# Patient Record
Sex: Female | Born: 1990 | Race: Black or African American | Hispanic: No | Marital: Single | Smoking: Never smoker
Health system: Southern US, Community
[De-identification: ages and names within clinical notes are randomized; demographics above are authoritative.]

## PROBLEM LIST (undated history)

## (undated) ENCOUNTER — Inpatient Hospital Stay (HOSPITAL_COMMUNITY): Payer: Self-pay

## (undated) DIAGNOSIS — Z789 Other specified health status: Secondary | ICD-10-CM

## (undated) DIAGNOSIS — R519 Headache, unspecified: Secondary | ICD-10-CM

## (undated) DIAGNOSIS — R51 Headache: Secondary | ICD-10-CM

## (undated) DIAGNOSIS — E669 Obesity, unspecified: Secondary | ICD-10-CM

## (undated) DIAGNOSIS — G932 Benign intracranial hypertension: Secondary | ICD-10-CM

## (undated) HISTORY — DX: Headache: R51

## (undated) HISTORY — PX: NO PAST SURGERIES: SHX2092

## (undated) HISTORY — DX: Obesity, unspecified: E66.9

## (undated) HISTORY — DX: Headache, unspecified: R51.9

---

## 2009-12-05 ENCOUNTER — Emergency Department (HOSPITAL_COMMUNITY): Admission: EM | Admit: 2009-12-05 | Discharge: 2009-12-05 | Payer: Self-pay | Admitting: Family Medicine

## 2009-12-05 ENCOUNTER — Emergency Department (HOSPITAL_COMMUNITY): Admission: EM | Admit: 2009-12-05 | Discharge: 2009-12-05 | Payer: Self-pay | Admitting: Emergency Medicine

## 2009-12-31 ENCOUNTER — Emergency Department (HOSPITAL_COMMUNITY): Admission: EM | Admit: 2009-12-31 | Discharge: 2009-12-31 | Payer: Self-pay | Admitting: Emergency Medicine

## 2010-04-27 ENCOUNTER — Emergency Department (HOSPITAL_COMMUNITY): Admission: EM | Admit: 2010-04-27 | Discharge: 2010-04-27 | Payer: Self-pay | Admitting: Emergency Medicine

## 2010-08-26 LAB — URINALYSIS, ROUTINE W REFLEX MICROSCOPIC
Ketones, ur: NEGATIVE mg/dL
Nitrite: NEGATIVE
pH: 6 (ref 5.0–8.0)

## 2010-08-26 LAB — PREGNANCY, URINE: Preg Test, Ur: NEGATIVE

## 2010-08-26 LAB — URINE MICROSCOPIC-ADD ON

## 2010-08-30 LAB — POCT PREGNANCY, URINE: Preg Test, Ur: NEGATIVE

## 2010-08-30 LAB — POCT I-STAT, CHEM 8
BUN: 9 mg/dL (ref 6–23)
Hemoglobin: 14.3 g/dL (ref 12.0–15.0)
Sodium: 141 mEq/L (ref 135–145)

## 2010-08-31 LAB — COMPREHENSIVE METABOLIC PANEL
ALT: 15 U/L (ref 0–35)
AST: 16 U/L (ref 0–37)
Albumin: 3.9 g/dL (ref 3.5–5.2)
Alkaline Phosphatase: 51 U/L (ref 39–117)
BUN: 7 mg/dL (ref 6–23)
CO2: 25 mEq/L (ref 19–32)
Calcium: 8.7 mg/dL (ref 8.4–10.5)
Chloride: 108 mEq/L (ref 96–112)
Creatinine, Ser: 0.8 mg/dL (ref 0.4–1.2)
GFR calc Af Amer: >60 mL/min (ref >60)
GFR calc non Af Amer: >60 mL/min (ref >60)
Glucose, Bld: 85 mg/dL (ref 70–99)
Potassium: 3.9 mEq/L (ref 3.5–5.1)
Sodium: 140 mEq/L (ref 135–145)
Total Bilirubin: 0.5 mg/dL (ref 0.3–1.2)
Total Protein: 7.3 g/dL (ref 6.0–8.3)

## 2010-08-31 LAB — DIFFERENTIAL
Basophils Absolute: 0.1 10*3/uL (ref 0.0–0.1)
Basophils Relative: 1 % (ref 0–1)
Eosinophils Absolute: 0 10*3/uL (ref 0.0–0.7)
Eosinophils Relative: 0 % (ref 0–5)
Lymphocytes Relative: 26 % (ref 12–46)
Lymphs Abs: 1.8 10*3/uL (ref 0.7–4.0)
Monocytes Absolute: 0.3 10*3/uL (ref 0.1–1.0)
Monocytes Relative: 4 % (ref 3–12)
Neutro Abs: 4.7 10*3/uL (ref 1.7–7.7)
Neutrophils Relative %: 69 % (ref 43–77)

## 2010-08-31 LAB — URINALYSIS, ROUTINE W REFLEX MICROSCOPIC
Bilirubin Urine: NEGATIVE
Glucose, UA: NEGATIVE mg/dL
Hgb urine dipstick: NEGATIVE
Protein, ur: NEGATIVE mg/dL
Specific Gravity, Urine: 1.028 (ref 1.005–1.030)

## 2010-08-31 LAB — CBC
HCT: 38.1 % (ref 36.0–46.0)
Hemoglobin: 12.5 g/dL (ref 12.0–15.0)
MCH: 29.4 pg (ref 26.0–34.0)
MCHC: 32.9 g/dL (ref 30.0–36.0)
MCV: 89.6 fL (ref 78.0–100.0)
Platelets: 251 10*3/uL (ref 150–400)
RBC: 4.25 MIL/uL (ref 3.87–5.11)
RDW: 15.2 % (ref 11.5–15.5)
WBC: 6.9 10*3/uL (ref 4.0–10.5)

## 2010-08-31 LAB — PREGNANCY, URINE: Preg Test, Ur: NEGATIVE

## 2010-09-04 ENCOUNTER — Emergency Department (HOSPITAL_COMMUNITY)
Admission: EM | Admit: 2010-09-04 | Discharge: 2010-09-04 | Disposition: A | Discharge status: Home / Self Care | Payer: Self-pay | Attending: Emergency Medicine | Admitting: Emergency Medicine

## 2011-01-28 ENCOUNTER — Other Ambulatory Visit: Payer: Self-pay | Admitting: Family Medicine

## 2011-01-28 DIAGNOSIS — Z3682 Encounter for antenatal screening for nuchal translucency: Secondary | ICD-10-CM

## 2011-01-28 LAB — RUBELLA ANTIBODY, IGM: Rubella: NON-IMMUNE/NOT IMMUNE

## 2011-01-28 LAB — HEPATITIS B SURFACE ANTIGEN: Hepatitis B Surface Ag: NEGATIVE

## 2011-01-28 LAB — RPR: RPR: NONREACTIVE

## 2011-02-04 ENCOUNTER — Ambulatory Visit (HOSPITAL_COMMUNITY)
Admission: RE | Admit: 2011-02-04 | Discharge: 2011-02-04 | Disposition: A | Discharge status: Home / Self Care | Payer: Medicaid Other | Source: Ambulatory Visit | Attending: Family Medicine | Admitting: Family Medicine

## 2011-02-04 ENCOUNTER — Encounter (HOSPITAL_COMMUNITY): Payer: Self-pay

## 2011-02-04 DIAGNOSIS — O3510X0 Maternal care for (suspected) chromosomal abnormality in fetus, unspecified, not applicable or unspecified: Secondary | ICD-10-CM | POA: Insufficient documentation

## 2011-02-04 DIAGNOSIS — Z3480 Encounter for supervision of other normal pregnancy, unspecified trimester: Secondary | ICD-10-CM

## 2011-02-04 DIAGNOSIS — Z3689 Encounter for other specified antenatal screening: Secondary | ICD-10-CM | POA: Insufficient documentation

## 2011-02-04 DIAGNOSIS — O351XX0 Maternal care for (suspected) chromosomal abnormality in fetus, not applicable or unspecified: Secondary | ICD-10-CM | POA: Insufficient documentation

## 2011-02-04 DIAGNOSIS — Z3682 Encounter for antenatal screening for nuchal translucency: Secondary | ICD-10-CM

## 2011-02-04 NOTE — Progress Notes (Signed)
Patient seen for ultrasound only appointment today.  Please see [facility] report for details.  

## 2011-03-04 ENCOUNTER — Other Ambulatory Visit: Payer: Self-pay | Admitting: Maternal and Fetal Medicine

## 2011-03-04 ENCOUNTER — Ambulatory Visit (HOSPITAL_COMMUNITY)
Admission: RE | Admit: 2011-03-04 | Discharge: 2011-03-04 | Disposition: A | Discharge status: Home / Self Care | Payer: Medicaid Other | Source: Ambulatory Visit | Attending: Family Medicine | Admitting: Family Medicine

## 2011-03-04 DIAGNOSIS — O3510X0 Maternal care for (suspected) chromosomal abnormality in fetus, unspecified, not applicable or unspecified: Secondary | ICD-10-CM | POA: Insufficient documentation

## 2011-03-04 DIAGNOSIS — O351XX0 Maternal care for (suspected) chromosomal abnormality in fetus, not applicable or unspecified: Secondary | ICD-10-CM | POA: Insufficient documentation

## 2011-03-25 ENCOUNTER — Other Ambulatory Visit: Payer: Self-pay | Admitting: Family Medicine

## 2011-03-25 DIAGNOSIS — Z3689 Encounter for other specified antenatal screening: Secondary | ICD-10-CM

## 2011-03-26 ENCOUNTER — Ambulatory Visit (HOSPITAL_COMMUNITY)
Admission: RE | Admit: 2011-03-26 | Discharge: 2011-03-26 | Disposition: A | Discharge status: Home / Self Care | Payer: Medicaid Other | Source: Ambulatory Visit | Attending: Family Medicine | Admitting: Family Medicine

## 2011-03-26 DIAGNOSIS — Z3689 Encounter for other specified antenatal screening: Secondary | ICD-10-CM

## 2011-03-26 DIAGNOSIS — O358XX0 Maternal care for other (suspected) fetal abnormality and damage, not applicable or unspecified: Secondary | ICD-10-CM | POA: Insufficient documentation

## 2011-03-26 DIAGNOSIS — Z363 Encounter for antenatal screening for malformations: Secondary | ICD-10-CM | POA: Insufficient documentation

## 2011-03-26 DIAGNOSIS — Z1389 Encounter for screening for other disorder: Secondary | ICD-10-CM | POA: Insufficient documentation

## 2011-04-23 ENCOUNTER — Inpatient Hospital Stay (HOSPITAL_COMMUNITY)
Admission: AD | Admit: 2011-04-23 | Discharge: 2011-04-23 | Disposition: A | Discharge status: Home / Self Care | Payer: Medicaid Other | Source: Ambulatory Visit | Attending: Obstetrics & Gynecology | Admitting: Obstetrics & Gynecology

## 2011-04-23 ENCOUNTER — Encounter (HOSPITAL_COMMUNITY): Payer: Self-pay | Admitting: *Deleted

## 2011-04-23 DIAGNOSIS — Z3490 Encounter for supervision of normal pregnancy, unspecified, unspecified trimester: Secondary | ICD-10-CM

## 2011-04-23 DIAGNOSIS — O4700 False labor before 37 completed weeks of gestation, unspecified trimester: Secondary | ICD-10-CM | POA: Insufficient documentation

## 2011-04-23 HISTORY — DX: Other specified health status: Z78.9

## 2011-04-23 LAB — URINALYSIS, ROUTINE W REFLEX MICROSCOPIC
Hgb urine dipstick: NEGATIVE
Leukocytes, UA: NEGATIVE
Nitrite: NEGATIVE
Protein, ur: NEGATIVE mg/dL
Urobilinogen, UA: 0.2 mg/dL (ref 0.0–1.0)

## 2011-04-23 LAB — WET PREP, GENITAL: Trich, Wet Prep: NONE SEEN

## 2011-04-23 LAB — AMNISURE RUPTURE OF MEMBRANE (ROM) NOT AT ARMC: Amnisure ROM: NEGATIVE

## 2011-04-23 NOTE — Progress Notes (Signed)
Pt in left sidelying position

## 2011-04-23 NOTE — Progress Notes (Signed)
Ultrasound called and reminded that pt is waiting for ultrasound. Given ginerale and informed that it would be a little longer before she went to ultrasound

## 2011-04-23 NOTE — Progress Notes (Signed)
Pt states she started trickle of blood about 1615. Pt states she then noticed some cramping and then pt states she noticed clear liquid

## 2011-04-23 NOTE — ED Provider Notes (Signed)
History     Chief Complaint  Patient presents with  . Contractions   HPI 20 y.o. at [redacted]w[redacted]d presents with c/o leaking of fluid today. Had wetness on clothing and a trickle of blood.  No contractions. Good fetal movement.  Worried she will leak fluid and not know it. OB History    Grav Para Term Preterm Abortions TAB SAB Ect Mult Living   2 1 1  0 0 0 0 0 0 1      Past Medical History  Diagnosis Date  . No pertinent past medical history     Past Surgical History  Procedure Date  . No past surgeries     No family history on file.  History  Substance Use Topics  . Smoking status: Never Smoker   . Smokeless tobacco: Not on file  . Alcohol Use: No    Allergies: No Known Allergies  Prescriptions prior to admission  Medication Sig Dispense Refill  . metoCLOPramide (REGLAN) 10 MG tablet Take 10 mg by mouth 4 (four) times daily. nausea       . prenatal vitamin w/FE, FA (PRENATAL 1 + 1) 27-1 MG TABS Take 1 tablet by mouth daily.          ROS As above  Physical Exam   Blood pressure 110/70, pulse 96, temperature 99.3 F (37.4 C), temperature source Oral, resp. rate 20, height 5\' 2"  (1.575 m), weight 100.517 kg (221 lb 9.6 oz), last menstrual period 11/12/2010, SpO2 99.00%.  Physical Exam  Constitutional: She is oriented to person, place, and time. She appears well-developed and well-nourished. No distress.  HENT:  Head: Normocephalic.  Cardiovascular: Normal rate.   Respiratory: Effort normal.  GI: Soft. There is no tenderness.  Genitourinary: Uterus normal. Vaginal discharge found.       No pooling, no ferning, no blood.   Musculoskeletal: Normal range of motion.  Neurological: She is alert and oriented to person, place, and time.  Skin: Skin is warm and dry.  Psychiatric: She has a normal mood and affect.    MAU Course  Procedures   Assessment and Plan  A:  Reports leaking and bleeding. No evidence of current leaking or bleeding. P:  WIll check [place]           [facility] 04/23/2011, 7:05 PM

## 2011-04-23 NOTE — Progress Notes (Signed)
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

## 2011-04-23 NOTE — Progress Notes (Signed)
Patient states she has been having irregular contractions. Had some spotting earlier then started to leak clear fluid. Not wearing a pad at this time, but states she had to change her panties. Reports good fetal movement.

## 2011-05-28 ENCOUNTER — Inpatient Hospital Stay (HOSPITAL_COMMUNITY)
Admission: AD | Admit: 2011-05-28 | Discharge: 2011-05-28 | Disposition: A | Discharge status: Home / Self Care | Payer: Medicaid Other | Source: Ambulatory Visit | Attending: Obstetrics & Gynecology | Admitting: Obstetrics & Gynecology

## 2011-05-28 ENCOUNTER — Encounter (HOSPITAL_COMMUNITY): Payer: Self-pay | Admitting: *Deleted

## 2011-05-28 DIAGNOSIS — R05 Cough: Secondary | ICD-10-CM | POA: Insufficient documentation

## 2011-05-28 DIAGNOSIS — J029 Acute pharyngitis, unspecified: Secondary | ICD-10-CM | POA: Insufficient documentation

## 2011-05-28 DIAGNOSIS — R059 Cough, unspecified: Secondary | ICD-10-CM | POA: Insufficient documentation

## 2011-05-28 DIAGNOSIS — R51 Headache: Secondary | ICD-10-CM | POA: Insufficient documentation

## 2011-05-28 DIAGNOSIS — R6889 Other general symptoms and signs: Secondary | ICD-10-CM

## 2011-05-28 DIAGNOSIS — O99891 Other specified diseases and conditions complicating pregnancy: Secondary | ICD-10-CM | POA: Insufficient documentation

## 2011-05-28 MED ORDER — OSELTAMIVIR PHOSPHATE 75 MG PO CAPS: 75.0000 mg | ORAL_CAPSULE | Freq: Two times a day (BID) | ORAL | Status: AC

## 2011-05-28 MED ORDER — ACETAMINOPHEN 500 MG PO CHEW: 500.0000 mg | CHEWABLE_TABLET | Freq: Four times a day (QID) | ORAL | Status: AC | PRN

## 2011-05-28 MED ORDER — ACETAMINOPHEN 325 MG PO TABS
650.0000 mg | ORAL_TABLET | Freq: Once | ORAL | Status: AC
  Administered 2011-05-28: 650 mg via ORAL
  Filled 2011-05-28: qty 2

## 2011-05-28 MED ORDER — ONDANSETRON 4 MG PO TBDP
4.0000 mg | ORAL_TABLET | Freq: Once | ORAL | Status: AC
  Administered 2011-05-28: 4 mg via ORAL
  Filled 2011-05-28: qty 1

## 2011-05-28 NOTE — ED Provider Notes (Signed)
History    CC: flu-like symptoms  HPI  Patient presents to MAU complaining of fever, chills, cough, sore throat, headache, and myalgias.  Symptoms started 2 days ago.  Patient has had a flu shot this year.  This morning, patient had one episode of emesis but has felt nauseated all day.  She has been able to keep down orange juice and fluids in the MAU.  Patient denies any diarrhea or abdominal pain.  Baby is moving well.  Denies any contractions, pelvic pain, vaginal bleeding, or discharge or burning with urination.  OB History    Grav Para Term Preterm Abortions TAB SAB Ect Mult Living   2 1 1  0 0 0 0 0 0 1      Past Medical History  Diagnosis Date  . No pertinent past medical history     Past Surgical History  Procedure Date  . No past surgeries     No family history on file.  History  Substance Use Topics  . Smoking status: Never Smoker   . Smokeless tobacco: Not on file  . Alcohol Use: No    Allergies: No Known Allergies  Prescriptions prior to admission  Medication Sig Dispense Refill  . prenatal vitamin w/FE, FA (PRENATAL 1 + 1) 27-1 MG TABS Take 1 tablet by mouth daily.        . metoCLOPramide (REGLAN) 10 MG tablet Take 10 mg by mouth 4 (four) times daily. nausea         ROS  Per HPI  Physical Exam   Blood pressure 130/69, pulse 115, temperature 100.7 F (38.2 C), temperature source Oral, resp. rate 16, height 5\' 3"  (1.6 m), weight 100.245 kg (221 lb), last menstrual period 11/12/2010, SpO2 98.00%.  Physical Exam  Constitutional: No distress.  HENT:  Head: Normocephalic and atraumatic.       MM dry  Eyes: Conjunctivae are normal. Pupils are equal, round, and reactive to light. Right eye exhibits no discharge. Left eye exhibits no discharge. No scleral icterus.  Neck: Normal range of motion. Neck supple.  Cardiovascular: Normal rate, regular rhythm, normal heart sounds and intact distal pulses.  Exam reveals no gallop and no friction rub.   No murmur  heard. Respiratory: Effort normal and breath sounds normal. She has no wheezes. She has no rales.  GI: Soft. Bowel sounds are normal. She exhibits no distension. There is no tenderness. There is no rebound and no guarding.  Musculoskeletal: Normal range of motion. She exhibits no edema and no tenderness.  Lymphadenopathy:    She has no cervical adenopathy.  Skin: No rash noted. No erythema.    MAU Course  Procedures Rapid strep, Flu PCR, NST  Assessment and Plan  1) Flu-like symptoms: will order flu PCR and rapid strep.  Will give Rx for Tamiflu and Tylenol.  Encourage pushing PO fluids and plenty of rest.  Patient able to tolerate PO fluids and crackers in MAU.  Red flags reviewed.  Follow up @ HD if symptoms worsen. 2) Reassuring FHR 3) Disposition: Discharge home  Discussed case with Caren Griffins who agreed with my assessment and plan.  DE LA Liu,Candace 05/28/2011, 8:34 PM    Agree with above Candace Liu 05/28/2011 11:11 PM

## 2011-05-28 NOTE — Progress Notes (Addendum)
Can't keep anything down. Feel awful, productive cough. Denies fever, chills.  Has had flu shot. (sister in law has the flu). Started on tues.

## 2011-05-28 NOTE — Progress Notes (Signed)
Pt. Hasn't been able to keep anything down for the last 2 days.  She has body aches and a cough.  She is also congested and just found out today that she has a fever.

## 2011-05-29 LAB — INFLUENZA PANEL BY PCR (TYPE A & B)
Influenza A By PCR: POSITIVE — AB
Influenza B By PCR: NEGATIVE

## 2011-05-29 LAB — RAPID STREP SCREEN (MED CTR MEBANE ONLY): Streptococcus, Group A Screen (Direct): NEGATIVE

## 2011-06-16 NOTE — L&D Delivery Note (Signed)
Delivery Note At 7:18 PM a viable and healthy female was delivered via Vaginal, Spontaneous Delivery (Presentation:OA).  APGAR: 8, 9; weight .   Placenta status: Intact, Spontaneous.  Cord: 3 vessels with the following complications: .None Head delivered in one push, shoulders delivered in the next.   No difficulty with shoulders Anesthesia: Epidural  Episiotomy: None Lacerations: 1 degree midline, not bleeding, not repaired Suture Repair:  Est. Blood Loss (mL): 150  Mom to postpartum.  Baby to nursery-stable.  [facility] 08/21/2011, 7:31 PM

## 2011-07-20 DIAGNOSIS — Z3490 Encounter for supervision of normal pregnancy, unspecified, unspecified trimester: Secondary | ICD-10-CM

## 2011-07-31 LAB — GC/CHLAMYDIA PROBE AMP, GENITAL: Chlamydia: NEGATIVE

## 2011-08-02 LAB — STREP B DNA PROBE: GBS: NEGATIVE

## 2011-08-13 ENCOUNTER — Encounter (HOSPITAL_COMMUNITY): Payer: Self-pay | Admitting: *Deleted

## 2011-08-13 ENCOUNTER — Inpatient Hospital Stay (HOSPITAL_COMMUNITY)
Admission: AD | Admit: 2011-08-13 | Discharge: 2011-08-13 | Disposition: A | Discharge status: Home / Self Care | Payer: Medicaid Other | Source: Ambulatory Visit | Attending: Obstetrics & Gynecology | Admitting: Obstetrics & Gynecology

## 2011-08-13 DIAGNOSIS — O479 False labor, unspecified: Secondary | ICD-10-CM

## 2011-08-13 MED ORDER — OXYCODONE-ACETAMINOPHEN 5-325 MG PO TABS: 2.0000 | ORAL_TABLET | Freq: Once | ORAL | Status: DC

## 2011-08-13 NOTE — Progress Notes (Signed)
Chief Complaint:  Contractions   Candace Liu is  21 y.o. G2P1001 at [redacted]w[redacted]d presents complaining of Contractions .  She states irregular, every 5-10 minutes contractions are associated with none vaginal bleeding, intact membranes, along with active fetal movement.   Obstetrical/Gynecological History: Menstrual History: OB History    Grav Para Term Preterm Abortions TAB SAB Ect Mult Living   2 1 1  0 0 0 0 0 0 1       Patient's last menstrual period was 11/12/2010.     Past Medical History: Past Medical History  Diagnosis Date  . No pertinent past medical history     Past Surgical History: Past Surgical History  Procedure Date  . No past surgeries     Family History: History reviewed. No pertinent family history.  Social History: History  Substance Use Topics  . Smoking status: Never Smoker   . Smokeless tobacco: Not on file  . Alcohol Use: No    Allergies: No Known Allergies  Meds:  Prescriptions prior to admission  Medication Sig Dispense Refill  . metoCLOPramide (REGLAN) 10 MG tablet Take 10 mg by mouth 4 (four) times daily. nausea       . Prenatal Vit-Fe Fumarate-FA (PRENATAL MULTIVITAMIN) TABS Take 1 tablet by mouth daily.      Marland Kitchen DISCONTD: prenatal vitamin w/FE, FA (PRENATAL 1 + 1) 27-1 MG TABS Take 1 tablet by mouth daily.          Review of Systems - Please refer to the aforementioned patients' reports.     Physical Exam  Blood pressure 134/81, pulse 97, temperature 98.1 F (36.7 C), temperature source Oral, resp. rate 20, height 5\' 5"  (1.651 m), weight 236 lb (107.049 kg), last menstrual period 11/12/2010, SpO2 98.00%. GENERAL: Well-developed, well-nourished female in no acute distress.  LUNGS: Clear to auscultation bilaterally.  HEART: Regular rate and rhythm. ABDOMEN: Soft, nontender, nondistended, gravid.  EXTREMITIES: Nontender, no edema, 2+ distal pulses. CERVICAL EXAM: Dilatation 4/cm   Effacement 100%   Station -2   Presentation:  cephalic FHT:  Baseline rate 140 bpm   Variability moderate  Accelerations present   Decelerations none Contractions: Every 3-8 mins  Pt has been observed for 3 hours without cervical change Labs: No results found for this or any previous visit (from the past 24 hour(s)). Imaging Studies:  No results found.  Assessment: [name] is  21 y.o. G2P1001 at [redacted]w[redacted]d presents with early/false labor.  Plan: D/C home.  Return when contractions increase in frequency/intensity, or SROM  CRESENZO-DISHMAN,Babe Clenney 2/28/201311:04 PM

## 2011-08-13 NOTE — Discharge Instructions (Signed)

## 2011-08-21 ENCOUNTER — Other Ambulatory Visit: Payer: Self-pay | Admitting: Obstetrics & Gynecology

## 2011-08-21 ENCOUNTER — Inpatient Hospital Stay (HOSPITAL_COMMUNITY): Payer: Medicaid Other | Admitting: Anesthesiology

## 2011-08-21 ENCOUNTER — Encounter (HOSPITAL_COMMUNITY): Payer: Self-pay | Admitting: Anesthesiology

## 2011-08-21 ENCOUNTER — Encounter (HOSPITAL_COMMUNITY): Payer: Self-pay | Admitting: *Deleted

## 2011-08-21 ENCOUNTER — Inpatient Hospital Stay (HOSPITAL_COMMUNITY)
Admission: AD | Admit: 2011-08-21 | Discharge: 2011-08-23 | DRG: 775 | Disposition: A | Discharge status: Home / Self Care | Payer: Medicaid Other | Source: Ambulatory Visit | Attending: Obstetrics and Gynecology | Admitting: Obstetrics and Gynecology

## 2011-08-21 LAB — CBC
HCT: 35.4 % — ABNORMAL LOW (ref 36.0–46.0)
MCH: 29 pg (ref 26.0–34.0)
MCHC: 33.1 g/dL (ref 30.0–36.0)
MCV: 88.3 fL (ref 78.0–100.0)
MCV: 88.6 fL (ref 78.0–100.0)
Platelets: 238 10*3/uL (ref 150–400)
RDW: 14.6 % (ref 11.5–15.5)
RDW: 14.7 % (ref 11.5–15.5)
WBC: 10.5 10*3/uL (ref 4.0–10.5)

## 2011-08-21 MED ORDER — EPHEDRINE 5 MG/ML INJ
10.0000 mg | INTRAVENOUS | Status: DC | PRN
  Filled 2011-08-21: qty 4

## 2011-08-21 MED ORDER — OXYCODONE-ACETAMINOPHEN 5-325 MG PO TABS: 1.0000 | ORAL_TABLET | ORAL | Status: DC | PRN

## 2011-08-21 MED ORDER — TERBUTALINE SULFATE 1 MG/ML IJ SOLN: 0.2500 mg | Freq: Once | INTRAMUSCULAR | Status: DC | PRN

## 2011-08-21 MED ORDER — LIDOCAINE-EPINEPHRINE (PF) 2 %-1:200000 IJ SOLN
INTRAMUSCULAR | Status: DC | PRN
  Administered 2011-08-21: 4 mL via EPIDURAL

## 2011-08-21 MED ORDER — PRENATAL MULTIVITAMIN CH: 1.0000 | ORAL_TABLET | Freq: Every day | ORAL | Status: DC

## 2011-08-21 MED ORDER — IBUPROFEN 600 MG PO TABS: 600.0000 mg | ORAL_TABLET | Freq: Four times a day (QID) | ORAL | Status: DC | PRN

## 2011-08-21 MED ORDER — DIPHENHYDRAMINE HCL 25 MG PO CAPS: 25.0000 mg | ORAL_CAPSULE | Freq: Four times a day (QID) | ORAL | Status: DC | PRN

## 2011-08-21 MED ORDER — TETANUS-DIPHTH-ACELL PERTUSSIS 5-2.5-18.5 LF-MCG/0.5 IM SUSP
0.5000 mL | Freq: Once | INTRAMUSCULAR | Status: AC
  Administered 2011-08-23: 0.5 mL via INTRAMUSCULAR
  Filled 2011-08-21: qty 0.5

## 2011-08-21 MED ORDER — PHENYLEPHRINE 40 MCG/ML (10ML) SYRINGE FOR IV PUSH (FOR BLOOD PRESSURE SUPPORT): 80.0000 ug | PREFILLED_SYRINGE | INTRAVENOUS | Status: DC | PRN

## 2011-08-21 MED ORDER — OXYCODONE-ACETAMINOPHEN 5-325 MG PO TABS
1.0000 | ORAL_TABLET | ORAL | Status: DC | PRN
  Administered 2011-08-22: 2 via ORAL

## 2011-08-21 MED ORDER — DIBUCAINE 1 % RE OINT: 1.0000 "application " | TOPICAL_OINTMENT | RECTAL | Status: DC | PRN

## 2011-08-21 MED ORDER — CITRIC ACID-SODIUM CITRATE 334-500 MG/5ML PO SOLN: 30.0000 mL | ORAL | Status: DC | PRN

## 2011-08-21 MED ORDER — BENZOCAINE-MENTHOL 20-0.5 % EX AERO
1.0000 "application " | INHALATION_SPRAY | CUTANEOUS | Status: DC | PRN
  Administered 2011-08-22: 1 via TOPICAL

## 2011-08-21 MED ORDER — WITCH HAZEL-GLYCERIN EX PADS: 1.0000 "application " | MEDICATED_PAD | CUTANEOUS | Status: DC | PRN

## 2011-08-21 MED ORDER — DIPHENHYDRAMINE HCL 50 MG/ML IJ SOLN: 12.5000 mg | INTRAMUSCULAR | Status: DC | PRN

## 2011-08-21 MED ORDER — EPHEDRINE 5 MG/ML INJ: 10.0000 mg | INTRAVENOUS | Status: DC | PRN

## 2011-08-21 MED ORDER — PRENATAL MULTIVITAMIN CH
1.0000 | ORAL_TABLET | Freq: Every day | ORAL | Status: DC
  Administered 2011-08-22 – 2011-08-23 (×2): 1 via ORAL
  Filled 2011-08-21 (×2): qty 1

## 2011-08-21 MED ORDER — SIMETHICONE 80 MG PO CHEW: 80.0000 mg | CHEWABLE_TABLET | ORAL | Status: DC | PRN

## 2011-08-21 MED ORDER — ONDANSETRON HCL 4 MG PO TABS
4.0000 mg | ORAL_TABLET | ORAL | Status: DC | PRN
  Administered 2011-08-22: 4 mg via ORAL
  Filled 2011-08-21: qty 1

## 2011-08-21 MED ORDER — SENNOSIDES-DOCUSATE SODIUM 8.6-50 MG PO TABS
2.0000 | ORAL_TABLET | Freq: Every day | ORAL | Status: DC
  Administered 2011-08-22: 2 via ORAL

## 2011-08-21 MED ORDER — OXYTOCIN 20 UNITS IN LACTATED RINGERS INFUSION - SIMPLE
1.0000 m[IU]/min | INTRAVENOUS | Status: DC
  Administered 2011-08-21: 1 m[IU]/min via INTRAVENOUS

## 2011-08-21 MED ORDER — FENTANYL 2.5 MCG/ML BUPIVACAINE 1/10 % EPIDURAL INFUSION (WH - ANES)
14.0000 mL/h | INTRAMUSCULAR | Status: DC
  Administered 2011-08-21: 14 mL/h via EPIDURAL
  Filled 2011-08-21 (×2): qty 60

## 2011-08-21 MED ORDER — ACETAMINOPHEN 325 MG PO TABS: 650.0000 mg | ORAL_TABLET | ORAL | Status: DC | PRN

## 2011-08-21 MED ORDER — LACTATED RINGERS IV SOLN
INTRAVENOUS | Status: DC
  Administered 2011-08-21 (×2): via INTRAVENOUS

## 2011-08-21 MED ORDER — OXYTOCIN BOLUS FROM INFUSION
500.0000 mL | Freq: Once | INTRAVENOUS | Status: DC
  Filled 2011-08-21: qty 500
  Filled 2011-08-21: qty 1000

## 2011-08-21 MED ORDER — NALBUPHINE SYRINGE 5 MG/0.5 ML: 5.0000 mg | INJECTION | INTRAMUSCULAR | Status: DC | PRN

## 2011-08-21 MED ORDER — LIDOCAINE HCL (PF) 1 % IJ SOLN
30.0000 mL | INTRAMUSCULAR | Status: DC | PRN
  Filled 2011-08-21: qty 30

## 2011-08-21 MED ORDER — FLEET ENEMA 7-19 GM/118ML RE ENEM: 1.0000 | ENEMA | RECTAL | Status: DC | PRN

## 2011-08-21 MED ORDER — ZOLPIDEM TARTRATE 5 MG PO TABS: 5.0000 mg | ORAL_TABLET | Freq: Every evening | ORAL | Status: DC | PRN

## 2011-08-21 MED ORDER — LACTATED RINGERS IV SOLN: 500.0000 mL | INTRAVENOUS | Status: DC | PRN

## 2011-08-21 MED ORDER — OXYTOCIN 20 UNITS IN LACTATED RINGERS INFUSION - SIMPLE
125.0000 mL/h | Freq: Once | INTRAVENOUS | Status: AC
  Administered 2011-08-21: 125 mL/h via INTRAVENOUS

## 2011-08-21 MED ORDER — IBUPROFEN 600 MG PO TABS
600.0000 mg | ORAL_TABLET | Freq: Four times a day (QID) | ORAL | Status: DC
  Administered 2011-08-21 – 2011-08-23 (×8): 600 mg via ORAL
  Filled 2011-08-21 (×7): qty 1

## 2011-08-21 MED ORDER — LANOLIN HYDROUS EX OINT: TOPICAL_OINTMENT | CUTANEOUS | Status: DC | PRN

## 2011-08-21 MED ORDER — PHENYLEPHRINE 40 MCG/ML (10ML) SYRINGE FOR IV PUSH (FOR BLOOD PRESSURE SUPPORT)
80.0000 ug | PREFILLED_SYRINGE | INTRAVENOUS | Status: DC | PRN
  Filled 2011-08-21: qty 5

## 2011-08-21 MED ORDER — LACTATED RINGERS IV SOLN
500.0000 mL | Freq: Once | INTRAVENOUS | Status: AC
  Administered 2011-08-21: 500 mL via INTRAVENOUS

## 2011-08-21 MED ORDER — ONDANSETRON HCL 4 MG/2ML IJ SOLN: 4.0000 mg | Freq: Four times a day (QID) | INTRAMUSCULAR | Status: DC | PRN

## 2011-08-21 MED ORDER — FENTANYL 2.5 MCG/ML BUPIVACAINE 1/10 % EPIDURAL INFUSION (WH - ANES)
INTRAMUSCULAR | Status: DC | PRN
  Administered 2011-08-21: 14 mL/h via EPIDURAL

## 2011-08-21 MED ORDER — ONDANSETRON HCL 4 MG/2ML IJ SOLN: 4.0000 mg | INTRAMUSCULAR | Status: DC | PRN

## 2011-08-21 NOTE — H&P (Signed)
Candace Liu is a 21 y.o. female presenting for advanced cervical dilation. Maternal Medical History:  Reason for admission: Reason for admission: contractions.  Contractions: Frequency: irregular.   Perceived severity is mild.    Fetal activity: Perceived fetal activity is normal.    Prenatal complications: No bleeding.     OB History    Grav Para Term Preterm Abortions TAB SAB Ect Mult Living   2 1 1  0 0 0 0 0 0 1     Past Medical History  Diagnosis Date  . No pertinent past medical history    Past Surgical History  Procedure Date  . No past surgeries    Family History: family history is not on file. Social History:  reports that she has never smoked. She does not have any smokeless tobacco history on file. She reports that she does not drink alcohol or use illicit drugs.  Review of Systems  Constitutional: Negative for fever.  Gastrointestinal: Negative for abdominal pain.  Genitourinary: Negative for dysuria.    Dilation: 5 Effacement (%): 100 Station: -2 Exam by:: cGrail RN Blood pressure 135/81, pulse 92, temperature 98.1 F (36.7 C), temperature source Oral, resp. rate 18, height 5\' 4"  (1.626 m), weight 222 lb (100.699 kg), last menstrual period 11/12/2010. Maternal Exam:  Uterine Assessment: Contraction strength is mild.  Contraction frequency is irregular.   Abdomen: Fundal height is 40.   Estimated fetal weight is 8.   Fetal presentation: vertex  Introitus: Normal vulva. Normal vagina.  Vagina is negative for discharge.  Ferning test: not done.  Nitrazine test: not done. Amniotic fluid character: not assessed.  Pelvis: adequate for delivery.   Cervix: Cervix evaluated by digital exam.     Physical Exam  Constitutional: She is oriented to person, place, and time. She appears well-developed and well-nourished.  HENT:  Head: Normocephalic.  Cardiovascular: Normal rate.   Respiratory: Effort normal.  GI: Soft. She exhibits no distension. There is no  tenderness. There is no rebound and no guarding.  Genitourinary: Vagina normal and uterus normal. No vaginal discharge found.       Cervix 5cm per [facility]  Musculoskeletal: Normal range of motion.  Neurological: She is alert and oriented to person, place, and time.  Skin: Skin is warm and dry.  Psychiatric: She has a normal mood and affect.    Prenatal labs: ABO, Rh: B/Positive/-- (08/15 0000) Antibody:   Rubella: Nonimmune (08/15 0000) RPR: Nonreactive (08/15 0000)  HBsAg:    HIV: Non-reactive (08/15 0000)  GBS: Negative (02/17 0000)   Assessment/Plan: A:  SIUP at [redacted]w[redacted]d      Advanced cervical dilation P:  Admit per Dr Macon Large      Routine Labor orders      Will start very low dose Pitocin     Anticipate SVD  [facility] 08/21/2011, 2:25 PM

## 2011-08-21 NOTE — Progress Notes (Signed)
This is a 21 y.o. at [redacted]w[redacted]d who was [facility] from [facility] for advanced dilation.  She was noted to be 5cm there and was sent here for augmentation of labor.  My exam noted her to be 4cm, stretched to 5.  Doing well  FHR 130s, borderline reactive now UCs every 3 min  Last Cervix Check:  Dilation: 7 Effacement (%): 100 Cervical Position: Anterior Station: 0 Presentation: Vertex Exam by:: Lorretta Harp RNC  Will anticipate SVD soon.

## 2011-08-21 NOTE — Anesthesia Preprocedure Evaluation (Addendum)

## 2011-08-21 NOTE — Anesthesia Procedure Notes (Signed)

## 2011-08-21 NOTE — H&P (Signed)
Attestation of Attending Supervision of Advanced Practitioner: Evaluation and management procedures were performed by the PA/NP/CNM/OB Fellow under my supervision/collaboration. Chart reviewed, and agree with management and plan.  Jammi Morrissette, M.D. 08/21/2011 2:41 PM   

## 2011-08-21 NOTE — Progress Notes (Signed)
Patient ID: Candace Liu, female   DOB: [birth year], 21 y.o.   MRN: [id number]  Doing well, UCs starting to pick up  FHR reactive UCs every 3-5 minutes, moderate  Cervix 5cm per RN  Will continue to use Pitocin and consider Amniotomy soon

## 2011-08-21 NOTE — Progress Notes (Signed)
Transferred to 116 via wheelchair, infant with pt

## 2011-08-22 MED ORDER — BENZOCAINE-MENTHOL 20-0.5 % EX AERO
INHALATION_SPRAY | CUTANEOUS | Status: AC
  Filled 2011-08-22: qty 56

## 2011-08-22 NOTE — Progress Notes (Signed)
Post Partum Day 1 Subjective: no complaints  Objective: Blood pressure 110/67, pulse 89, temperature 97.7 F (36.5 C), temperature source Oral, resp. rate 18, height 5\' 4"  (1.626 m), weight 222 lb (100.699 kg), last menstrual period 11/12/2010, SpO2 98.00%, unknown if currently breastfeeding.  Physical Exam:  General: alert, cooperative and no distress Lochia: appropriate Uterine Fundus: firm Incision: n/a DVT Evaluation: No evidence of DVT seen on physical exam.   Basename 08/21/11 1436 08/21/11 1220  HGB 11.2* 11.7*  HCT 34.2* 35.4*    Assessment/Plan: Plan for discharge tomorrow, Breastfeeding and Lactation consult   LOS: 1 day   Candace Liu E. 08/22/2011, 7:12 AM

## 2011-08-22 NOTE — Anesthesia Postprocedure Evaluation (Signed)
  Anesthesia Post-op Note  Patient: Candace Liu  Procedure(s) Performed: * No procedures listed *  Patient Location: PACU  Anesthesia Type: Epidural  Level of Consciousness: awake, alert  and oriented  Airway and Oxygen Therapy: Patient Spontanous Breathing  Post-op Pain: none  Post-op Assessment: Post-op Vital signs reviewed and Patient's Cardiovascular Status Stable  Post-op Vital Signs: Reviewed and stable  Complications: No apparent anesthesia complications

## 2011-08-23 MED ORDER — MEASLES, MUMPS & RUBELLA VAC ~~LOC~~ INJ
0.5000 mL | INJECTION | Freq: Once | SUBCUTANEOUS | Status: AC
  Administered 2011-08-23: 0.5 mL via SUBCUTANEOUS
  Filled 2011-08-23: qty 0.5

## 2011-08-23 MED ORDER — IBUPROFEN 600 MG PO TABS: 600.0000 mg | ORAL_TABLET | Freq: Four times a day (QID) | ORAL | Status: AC

## 2011-08-23 NOTE — Discharge Summary (Signed)
Obstetric Discharge Summary Reason for Admission: induction of labor Prenatal Procedures: none Intrapartum Procedures: spontaneous vaginal delivery Postpartum Procedures: none Complications-Operative and Postpartum: 1st degree perineal laceration- not repaired Hemoglobin  Date Value Range Status  08/21/2011 11.2* 12.0-15.0 (g/dL) Final     HCT  Date Value Range Status  08/21/2011 34.2* 36.0-46.0 (%) Final    Discharge Diagnoses: Term Pregnancy-delivered  Discharge Information: Date: 08/23/2011 Activity: pelvic rest Diet: routine Medications: PNV and Ibuprofen Condition: stable Instructions: refer to practice specific booklet Discharge to: home Follow-up Information    Schedule an appointment as soon as possible for a visit with [facility] [place]. (for 4-6 weeks)    Contact information:   [address] [employer] [place] [address]          Newborn Data: Live born female  Birth Weight: 8 lb 6.8 oz (3820 g) APGAR: 8, 9  Home with mother.  Cam Hai 08/23/2011, 8:45 AM

## 2011-08-23 NOTE — Progress Notes (Signed)
Post Partum Day #2 Subjective: no complaints and up ad lib; breastfeeding but having difficulty with latch on right; no longer planning BTL but wants IUD instead  Objective: Blood pressure 117/75, pulse 80, temperature 98.4 F (36.9 C), temperature source Oral, resp. rate 20, height 5\' 4"  (1.626 m), weight 100.699 kg (222 lb), last menstrual period 11/12/2010, SpO2 98.00%, unknown if currently breastfeeding.  Physical Exam:  General: alert, cooperative and no distress Lochia: appropriate Uterine Fundus: firm    Basename 08/21/11 1436 08/21/11 1220  HGB 11.2* 11.7*  HCT 34.2* 35.4*    Assessment/Plan: Plan for discharge tomorrow and Lactation consult Rx Motrin for pain F/U at [facility] in 4-6 weeks or prn   LOS: 2 days   Candace Liu 08/23/2011, 8:42 AM

## 2011-08-23 NOTE — Discharge Instructions (Signed)
Vaginal Delivery Care After  Change your pad on each trip to the bathroom.   Wipe gently with toilet paper during your hospital stay. Always wipe from front to back. A spray bottle with warm tap water could also be used or a towelette if available.   Place your soiled pad and toilet paper in a bathroom wastebasket with a plastic bag liner.   During your hospital stay, save any clots. If you pass a clot while on the toilet, do not flush it. Also, if your vaginal flow seems excessive to you, notify nursing personnel.   The first time you get out of bed after delivery, wait for assistance from a nurse. Do not get up alone at any time if you feel weak or dizzy.   Bend and extend your ankles forcefully so that you feel the calves of your legs get hard. Do this 6 times every hour when you are in bed and awake.   Do not sit with one foot under you, dangle your legs over the edge of the bed, or maintain a position that hinders the circulation in your legs.   Many women experience after pains for 2 to 3 days after delivery. These after pains are mild uterine contractions. Ask the nurse for a pain medication if you need something for this. Sometimes breastfeeding stimulates after pains; if you find this to be true, ask for the medication  -  hour before the next feeding.   For you and your infant's protection, do not go beyond the door(s) of the obstetric unit. Do not carry your baby in your arms in the hallway. When taking your baby to and from your room, put your baby in the bassinet and push the bassinet.   Mothers may have their babies in their room as much as they desire.  Document Released: 05/29/2000 Document Revised: 05/21/2011 Document Reviewed: 04/29/2007 Exit[employer] Patient Information 2012 [employer]. 

## 2011-08-25 NOTE — Discharge Summary (Signed)
Attestation of Attending Supervision of Advanced Practitioner: Evaluation and management procedures were performed by the PA/NP/CNM/OB Fellow under my supervision/collaboration. Chart reviewed, and agree with management and plan.  Antrice Pal, M.D. 08/25/2011 10:20 AM   

## 2011-08-25 NOTE — Progress Notes (Signed)
Post discharge chart review completed.  

## 2012-01-24 ENCOUNTER — Encounter (HOSPITAL_COMMUNITY): Payer: Self-pay | Admitting: *Deleted

## 2012-01-24 ENCOUNTER — Emergency Department (HOSPITAL_COMMUNITY)
Admission: EM | Admit: 2012-01-24 | Discharge: 2012-01-25 | Disposition: A | Discharge status: Home / Self Care | Payer: No Typology Code available for payment source | Attending: Emergency Medicine | Admitting: Emergency Medicine

## 2012-01-24 ENCOUNTER — Emergency Department (HOSPITAL_COMMUNITY): Payer: No Typology Code available for payment source

## 2012-01-24 DIAGNOSIS — Y998 Other external cause status: Secondary | ICD-10-CM | POA: Insufficient documentation

## 2012-01-24 DIAGNOSIS — Y93I9 Activity, other involving external motion: Secondary | ICD-10-CM | POA: Insufficient documentation

## 2012-01-24 DIAGNOSIS — S40019A Contusion of unspecified shoulder, initial encounter: Secondary | ICD-10-CM | POA: Insufficient documentation

## 2012-01-24 DIAGNOSIS — S40012A Contusion of left shoulder, initial encounter: Secondary | ICD-10-CM

## 2012-01-24 HISTORY — DX: Benign intracranial hypertension: G93.2

## 2012-01-24 MED ORDER — IBUPROFEN 200 MG PO TABS
600.0000 mg | ORAL_TABLET | Freq: Once | ORAL | Status: AC
  Administered 2012-01-24: 600 mg via ORAL
  Filled 2012-01-24: qty 1

## 2012-01-24 NOTE — ED Notes (Signed)
Patient ambulatory to xray and returned.
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
## 2012-07-09 ENCOUNTER — Emergency Department (HOSPITAL_COMMUNITY): Payer: Self-pay

## 2012-07-09 ENCOUNTER — Emergency Department (HOSPITAL_COMMUNITY)
Admission: EM | Admit: 2012-07-09 | Discharge: 2012-07-09 | Disposition: A | Discharge status: Home / Self Care | Payer: Self-pay | Attending: Emergency Medicine | Admitting: Emergency Medicine

## 2012-07-09 DIAGNOSIS — R05 Cough: Secondary | ICD-10-CM | POA: Insufficient documentation

## 2012-07-09 DIAGNOSIS — Z8669 Personal history of other diseases of the nervous system and sense organs: Secondary | ICD-10-CM | POA: Insufficient documentation

## 2012-07-09 DIAGNOSIS — R197 Diarrhea, unspecified: Secondary | ICD-10-CM | POA: Insufficient documentation

## 2012-07-09 DIAGNOSIS — B9789 Other viral agents as the cause of diseases classified elsewhere: Secondary | ICD-10-CM | POA: Insufficient documentation

## 2012-07-09 DIAGNOSIS — B349 Viral infection, unspecified: Secondary | ICD-10-CM

## 2012-07-09 DIAGNOSIS — R059 Cough, unspecified: Secondary | ICD-10-CM | POA: Insufficient documentation

## 2012-07-09 DIAGNOSIS — Z3202 Encounter for pregnancy test, result negative: Secondary | ICD-10-CM | POA: Insufficient documentation

## 2012-07-09 LAB — PREGNANCY, URINE: Preg Test, Ur: NEGATIVE

## 2012-07-09 MED ORDER — ONDANSETRON HCL 8 MG PO TABS: 8.0000 mg | ORAL_TABLET | Freq: Three times a day (TID) | ORAL | Status: DC | PRN

## 2012-07-09 MED ORDER — ONDANSETRON 8 MG PO TBDP
8.0000 mg | ORAL_TABLET | Freq: Once | ORAL | Status: AC
  Administered 2012-07-09: 8 mg via ORAL
  Filled 2012-07-09: qty 1

## 2012-07-09 MED ORDER — HYDROCOD POLST-CHLORPHEN POLST 10-8 MG/5ML PO LQCR: 5.0000 mL | Freq: Two times a day (BID) | ORAL | Status: DC

## 2012-07-09 MED ORDER — ONDANSETRON 8 MG PO TBDP
8.0000 mg | ORAL_TABLET | Freq: Once | ORAL | Status: AC
Start: 2012-07-09 — End: 2012-07-09
  Administered 2012-07-09: 8 mg via ORAL
  Filled 2012-07-09: qty 1

## 2012-07-09 NOTE — ED Notes (Signed)
Pt states she has had n/v/d for the past week. Pt also c/o nasal congestion, productive cough with yellow sputum. Denies fever. Pt states she has coughed hard and had some bright red blood in her sputum. Pt states she has taken OTC meds and they are not helping. Pt states she did not get the flu shot this year.

## 2012-07-09 NOTE — ED Provider Notes (Signed)
History     CSN: [id number]  Arrival date & time 07/09/12  4098   First MD Initiated Contact with Patient 07/09/12 1932      Chief Complaint  Patient presents with  . N/V/D   . Cough    (Consider location/radiation/quality/duration/timing/severity/associated sxs/prior treatment) HPI Comments: Candace Liu is a 22 y.o. Female who complains of cough with posttussive vomiting, for 1 week. She denies fever, chills, diarrhea, abdominal pain, weakness, or dizziness. She did not have a flu immunization this year. She denies fever in the last few days. She has increased nausea with eating or drinking. There are no other modifying factors. Her son, is sick with a similar illness.  Patient is a 22 y.o. female presenting with cough. The history is provided by the patient.  Cough    Past Medical History  Diagnosis Date  . No pertinent past medical history   . Pseudotumor cerebri     Past Surgical History  Procedure Date  . No past surgeries     No family history on file.  History  Substance Use Topics  . Smoking status: Never Smoker   . Smokeless tobacco: Not on file  . Alcohol Use: No    OB History    Grav Para Term Preterm Abortions TAB SAB Ect Mult Living   2 2 2  0 0 0 0 0 0 2      Review of Systems  Respiratory: Positive for cough.   All other systems reviewed and are negative.    Allergies  Review of patient's allergies indicates no known allergies.  Home Medications   Current Outpatient Rx  Name  Route  Sig  Dispense  Refill  . BISMUTH SUBSALICYLATE 262 MG/15ML PO SUSP   Oral   Take 15 mLs by mouth every 6 (six) hours as needed. For stomach upset         . OVER THE COUNTER MEDICATION   Oral   Take 30 mLs by mouth every 4 (four) hours as needed. For cold. wal-flu         . HYDROCOD POLST-CPM POLST ER 10-8 MG/5ML PO LQCR   Oral   Take 5 mLs by mouth every 12 (twelve) hours.   140 mL   0   . ONDANSETRON HCL 8 MG PO TABS   Oral   Take 1 tablet  (8 mg total) by mouth every 8 (eight) hours as needed for nausea.   20 tablet   0     BP 103/49  Pulse 89  Temp 98.9 F (37.2 C) (Oral)  Resp 18  SpO2 100%  LMP 06/15/2012  Physical Exam  Nursing note and vitals reviewed. Constitutional: She is oriented to person, place, and time. She appears well-developed and well-nourished.  HENT:  Head: Normocephalic and atraumatic.       Mucous membranes moist  Eyes: Conjunctivae normal and EOM are normal. Pupils are equal, round, and reactive to light.  Neck: Normal range of motion and phonation normal. Neck supple.  Cardiovascular: Normal rate, regular rhythm and intact distal pulses.   Pulmonary/Chest: Effort normal and breath sounds normal. She exhibits no tenderness.  Abdominal: Soft. She exhibits no distension. There is no tenderness. There is no guarding.  Musculoskeletal: Normal range of motion.  Neurological: She is alert and oriented to person, place, and time. She has normal strength. She exhibits normal muscle tone.  Skin: Skin is warm and dry.  Psychiatric: She has a normal mood and affect. Her behavior is  normal. Judgment and thought content normal.    ED Course  Procedures (including critical care time)   Treated with Zofran orally. She was able to tolerate oral fluids after that   Labs Reviewed  PREGNANCY, URINE   Dg Chest 2 View  07/09/2012  *RADIOLOGY REPORT*  Clinical Data: Congestion.  Fever.  CHEST - 2 VIEW  Comparison: 12/05/2009.  Findings: Minimal peribronchial thickening.  No infiltrate, congestive heart failure or pneumothorax.  Heart size within normal limits.  IMPRESSION: No acute abnormality.  Please see above.   Original Report Authenticated By: Lacy Duverney, M.D.    Nursing notes, applicable records and vitals reviewed.  Radiologic Images/Reports reviewed.   1. Viral syndrome       MDM  Viral syndrome. Improved, with treatment in the ED.Doubt metabolic instability, serious bacterial infection  or impending vascular collapse; the patient is stable for discharge.    Plan: Home Medications- symptomatic; Home Treatments- rest, push fluids; Recommended follow up- PCP of choice, PRN     Flint Melter, MD 07/10/12 0025

## 2013-03-15 ENCOUNTER — Encounter (HOSPITAL_COMMUNITY): Payer: Self-pay | Admitting: Emergency Medicine

## 2013-03-15 ENCOUNTER — Emergency Department (HOSPITAL_COMMUNITY)
Admission: EM | Admit: 2013-03-15 | Discharge: 2013-03-16 | Disposition: A | Discharge status: Home / Self Care | Payer: Medicaid Other | Attending: Emergency Medicine | Admitting: Emergency Medicine

## 2013-03-15 DIAGNOSIS — R109 Unspecified abdominal pain: Secondary | ICD-10-CM | POA: Insufficient documentation

## 2013-03-15 DIAGNOSIS — N938 Other specified abnormal uterine and vaginal bleeding: Secondary | ICD-10-CM | POA: Insufficient documentation

## 2013-03-15 DIAGNOSIS — Z3202 Encounter for pregnancy test, result negative: Secondary | ICD-10-CM | POA: Insufficient documentation

## 2013-03-15 DIAGNOSIS — N925 Other specified irregular menstruation: Secondary | ICD-10-CM | POA: Insufficient documentation

## 2013-03-15 DIAGNOSIS — N949 Unspecified condition associated with female genital organs and menstrual cycle: Secondary | ICD-10-CM | POA: Insufficient documentation

## 2013-03-15 DIAGNOSIS — R110 Nausea: Secondary | ICD-10-CM | POA: Insufficient documentation

## 2013-03-15 DIAGNOSIS — Z8669 Personal history of other diseases of the nervous system and sense organs: Secondary | ICD-10-CM | POA: Insufficient documentation

## 2013-03-15 LAB — URINALYSIS, ROUTINE W REFLEX MICROSCOPIC
Bilirubin Urine: NEGATIVE
Nitrite: NEGATIVE
Specific Gravity, Urine: 1.024 (ref 1.005–1.030)
pH: 8 (ref 5.0–8.0)

## 2013-03-15 LAB — COMPREHENSIVE METABOLIC PANEL
ALT: 14 U/L (ref 0–35)
AST: 15 U/L (ref 0–37)
Albumin: 3.5 g/dL (ref 3.5–5.2)
Calcium: 8.9 mg/dL (ref 8.4–10.5)
GFR calc Af Amer: >90 mL/min (ref >90)
Sodium: 141 mEq/L (ref 135–145)
Total Protein: 7.1 g/dL (ref 6.0–8.3)

## 2013-03-15 LAB — CBC WITH DIFFERENTIAL/PLATELET
Basophils Absolute: 0 10*3/uL (ref 0.0–0.1)
Basophils Relative: 0 % (ref 0–1)
Eosinophils Absolute: 0 10*3/uL (ref 0.0–0.7)
Eosinophils Relative: 0 % (ref 0–5)
MCH: 28.6 pg (ref 26.0–34.0)
MCV: 88 fL (ref 78.0–100.0)
Platelets: 295 10*3/uL (ref 150–400)
RDW: 13.9 % (ref 11.5–15.5)

## 2013-03-15 LAB — URINE MICROSCOPIC-ADD ON

## 2013-03-15 NOTE — ED Notes (Signed)
Pt. Reports heavy vaginal bleeding onset 4 days ago , low abdominal cramping , pt. Stated her LMP was 2 weeks ago.

## 2013-03-15 NOTE — ED Provider Notes (Signed)
CSN: [id number]     Arrival date & time 03/15/13  1900 History   First MD Initiated Contact with Patient 03/15/13 2244     Chief Complaint  Patient presents with  . Vaginal Bleeding   (Consider location/radiation/quality/duration/timing/severity/associated sxs/prior Treatment) The history is provided by the patient and medical records. No language interpreter was used.    Candace Liu is a 22 y.o. female  [phone] with a hx of pseudotumor cerebri presents to the Emergency Department complaining of gradual, persistent, progressively worsening vaginal bleeding with associated lower abdominal cramping onset 3 days ago.  Pt reports the abd pain is better with movement. She has attempted to use Midol without relief.  Nothing seems to make it worse.  Pt reports use of tampons with changes up to 5x per day which is heavier than normal.  Pt normally has very regular 28 days cycles without birth control use.  LMP was 2 week ago.  Pt with last PAP 2 years ago and normal.  Pt denies urinary urgency, vaginal discharge, dysuria, fever, headache, chest pain, SOB.  She is sexually active with 1 female partner for greater than 3 years. She has no history of STD.   Past Medical History  Diagnosis Date  . No pertinent past medical history   . Pseudotumor cerebri    Past Surgical History  Procedure Laterality Date  . No past surgeries     No family history on file. History  Substance Use Topics  . Smoking status: Never Smoker   . Smokeless tobacco: Not on file  . Alcohol Use: No   OB History   Grav Para Term Preterm Abortions TAB SAB Ect Mult Living   2 2 2  0 0 0 0 0 0 2     Review of Systems  Constitutional: Negative for fever, diaphoresis, appetite change, fatigue and unexpected weight change.  HENT: Negative for neck pain and neck stiffness.   Respiratory: Negative for chest tightness and shortness of breath.   Cardiovascular: Negative for chest pain.  Gastrointestinal: Positive for nausea and  abdominal pain. Negative for vomiting, diarrhea, constipation, blood in stool, abdominal distention and rectal pain.  Genitourinary: Positive for vaginal bleeding, menstrual problem and pelvic pain. Negative for dysuria, urgency, frequency, hematuria, flank pain, vaginal discharge, difficulty urinating and vaginal pain.  Musculoskeletal: Negative for back pain.  Skin: Negative for rash.  Neurological: Negative for dizziness, weakness and light-headedness.  All other systems reviewed and are negative.    Allergies  Review of patient's allergies indicates no known allergies.  Home Medications   Current Outpatient Rx  Name  Route  Sig  Dispense  Refill  . naproxen sodium (ANAPROX) 220 MG tablet   Oral   Take 440 mg by mouth daily as needed (cramps).         . medroxyPROGESTERone (PROVERA) 5 MG tablet   Oral   Take 2 tablets (10 mg total) by mouth daily.   20 tablet   0    BP 133/75  Pulse 75  Temp(Src) 98.6 F (37 C) (Oral)  Resp 12  Ht 5\' 5"  (1.651 m)  Wt 215 lb 7 oz (97.722 kg)  BMI 35.85 kg/m2  SpO2 100%  LMP 02/28/2013 Physical Exam  Nursing note and vitals reviewed. Constitutional: She is oriented to person, place, and time. She appears well-developed and well-nourished. No distress.  HENT:  Head: Normocephalic and atraumatic.  No pallor of mucous membranes  Eyes: Conjunctivae are normal. Pupils are equal, round, and reactive  to light. No scleral icterus.  Neck: Normal range of motion. Neck supple.  Cardiovascular: Normal rate, regular rhythm, normal heart sounds and intact distal pulses.   No murmur heard. No tachycardia  Pulmonary/Chest: Effort normal and breath sounds normal. No respiratory distress. She has no wheezes.  Abdominal: Soft. Normal appearance and bowel sounds are normal. She exhibits no mass. There is no hepatosplenomegaly. There is tenderness in the suprapubic area. There is no rigidity, no rebound, no guarding, no CVA tenderness, no tenderness at  McBurney's point and negative Murphy's sign. Hernia confirmed negative in the right inguinal area and confirmed negative in the left inguinal area.  Mild suprapubic tenderness no guarding no rigidity no rebound  Genitourinary: Uterus normal. Pelvic exam was performed with patient supine. There is no rash, tenderness, lesion or injury on the right labia. There is no rash, tenderness, lesion or injury on the left labia. Uterus is not deviated, not enlarged, not fixed and not tender. Cervix exhibits no motion tenderness, no discharge and no friability. Right adnexum displays no mass, no tenderness and no fullness. Left adnexum displays no mass, no tenderness and no fullness. There is bleeding around the vagina. No erythema or tenderness around the vagina. No foreign body around the vagina. No signs of injury around the vagina. No vaginal discharge found.  Moderate amount of blood in the vaginal vault No lesions or lacerations No adnexal masses or tenderness No cervical motion tenderness  Musculoskeletal: Normal range of motion. She exhibits no edema.  Lymphadenopathy:    She has no cervical adenopathy.       Right: No inguinal adenopathy present.       Left: No inguinal adenopathy present.  Neurological: She is alert and oriented to person, place, and time. She exhibits normal muscle tone. Coordination normal.  Speech is clear and goal oriented Moves extremities without ataxia  Skin: Skin is warm and dry. No rash noted. She is not diaphoretic. No pallor.  No pallor  Psychiatric: She has a normal mood and affect.    ED Course  Procedures (including critical care time) Labs Review Labs Reviewed  WET PREP, GENITAL - Abnormal; Notable for the following:    Clue Cells Wet Prep HPF POC FEW (*)    All other components within normal limits  COMPREHENSIVE METABOLIC PANEL - Abnormal; Notable for the following:    Total Bilirubin 0.2 (*)    All other components within normal limits  URINALYSIS,  ROUTINE W REFLEX MICROSCOPIC - Abnormal; Notable for the following:    APPearance CLOUDY (*)    Hgb urine dipstick LARGE (*)    Urobilinogen, UA 2.0 (*)    All other components within normal limits  URINE MICROSCOPIC-ADD ON - Abnormal; Notable for the following:    Bacteria, UA FEW (*)    All other components within normal limits  GC/CHLAMYDIA PROBE AMP  CBC WITH DIFFERENTIAL  POCT PREGNANCY, URINE   Imaging Review No results found.  MDM   1. Dysfunctional uterine bleeding      Candace Liu presents with vaginal bleeding for 2 days.  Hx and PE consistent with dysfunctional uterine bleeding.  Pregnancy test negative; no adnexal masses or tenderness on exam.  No evidence of ectopic pregnancy, TOA, STD or vaginal infection.  No lesions noted on pelvic exam and pt denies all trauma or possible injury.    Pt with stable vital signs, no anemia on CBC, no evidence of UTI.  Patient is nontoxic, nonseptic appearing, in no apparent  distress.  Labs and vitals reviewed.  Patient does not meet the SIRS or Sepsis criteria.  On repeat exam patient does not have a surgical abdomin and there are no peritoneal signs.  No indication of appendicitis, bowel obstruction, bowel perforation, cholecystitis, diverticulitis, PID or ectopic pregnancy.  Patient discharged home with progesterone treatment and given strict instructions for follow-up with their GYN.  I have also discussed reasons to return immediately to the ER.  Patient expresses understanding and agrees with plan.  It has been determined that no acute conditions requiring further emergency intervention are present at this time. The patient/guardian have been advised of the diagnosis and plan. We have discussed signs and symptoms that warrant return to the ED, such as changes or worsening in symptoms.   Vital signs are stable at discharge.   BP 133/75  Pulse 75  Temp(Src) 98.6 F (37 C) (Oral)  Resp 12  Ht 5\' 5"  (1.651 m)  Wt 215 lb 7 oz (97.722  kg)  BMI 35.85 kg/m2  SpO2 100%  LMP 02/28/2013  Patient/guardian has voiced understanding and agreed to follow-up with the PCP or specialist.         Dierdre Forth, PA-C 03/16/13 [phone]

## 2013-03-16 LAB — WET PREP, GENITAL
Trich, Wet Prep: NONE SEEN
WBC, Wet Prep HPF POC: NONE SEEN
Yeast Wet Prep HPF POC: NONE SEEN

## 2013-03-16 MED ORDER — MEDROXYPROGESTERONE ACETATE 5 MG PO TABS: 10.0000 mg | ORAL_TABLET | Freq: Every day | ORAL | Status: DC

## 2013-03-16 NOTE — ED Provider Notes (Signed)
Medical screening examination/treatment/procedure(s) were performed by non-physician practitioner and as supervising physician I was immediately available for consultation/collaboration.   Gwyneth Sprout, MD 03/16/13 [phone]

## 2013-03-17 LAB — GC/CHLAMYDIA PROBE AMP: CT Probe RNA: NEGATIVE

## 2013-04-06 ENCOUNTER — Other Ambulatory Visit: Payer: Self-pay | Admitting: Medical

## 2013-04-06 ENCOUNTER — Encounter: Payer: Medicaid Other | Admitting: Medical

## 2013-04-06 DIAGNOSIS — N938 Other specified abnormal uterine and vaginal bleeding: Secondary | ICD-10-CM

## 2013-04-18 ENCOUNTER — Ambulatory Visit (HOSPITAL_COMMUNITY)
Admission: RE | Admit: 2013-04-18 | Discharge: 2013-04-18 | Disposition: A | Discharge status: Home / Self Care | Payer: Medicaid Other | Source: Ambulatory Visit | Attending: Medical | Admitting: Medical

## 2013-04-18 ENCOUNTER — Ambulatory Visit (HOSPITAL_COMMUNITY): Payer: Medicaid Other

## 2013-04-18 DIAGNOSIS — N949 Unspecified condition associated with female genital organs and menstrual cycle: Secondary | ICD-10-CM | POA: Insufficient documentation

## 2013-04-18 DIAGNOSIS — N925 Other specified irregular menstruation: Secondary | ICD-10-CM | POA: Insufficient documentation

## 2013-04-18 DIAGNOSIS — N938 Other specified abnormal uterine and vaginal bleeding: Secondary | ICD-10-CM | POA: Insufficient documentation

## 2013-05-01 ENCOUNTER — Encounter: Payer: Medicaid Other | Admitting: Medical

## 2013-07-08 ENCOUNTER — Emergency Department (HOSPITAL_COMMUNITY): Payer: Self-pay

## 2013-07-08 ENCOUNTER — Encounter (HOSPITAL_COMMUNITY): Payer: Self-pay | Admitting: Emergency Medicine

## 2013-07-08 ENCOUNTER — Emergency Department (HOSPITAL_COMMUNITY)
Admission: EM | Admit: 2013-07-08 | Discharge: 2013-07-08 | Disposition: A | Discharge status: Home / Self Care | Payer: Self-pay | Attending: Emergency Medicine | Admitting: Emergency Medicine

## 2013-07-08 DIAGNOSIS — Z8669 Personal history of other diseases of the nervous system and sense organs: Secondary | ICD-10-CM | POA: Insufficient documentation

## 2013-07-08 DIAGNOSIS — Z79899 Other long term (current) drug therapy: Secondary | ICD-10-CM | POA: Insufficient documentation

## 2013-07-08 DIAGNOSIS — J3489 Other specified disorders of nose and nasal sinuses: Secondary | ICD-10-CM | POA: Insufficient documentation

## 2013-07-08 DIAGNOSIS — R51 Headache: Secondary | ICD-10-CM | POA: Insufficient documentation

## 2013-07-08 DIAGNOSIS — J40 Bronchitis, not specified as acute or chronic: Secondary | ICD-10-CM | POA: Insufficient documentation

## 2013-07-08 DIAGNOSIS — R1110 Vomiting, unspecified: Secondary | ICD-10-CM | POA: Insufficient documentation

## 2013-07-08 MED ORDER — ALBUTEROL SULFATE HFA 108 (90 BASE) MCG/ACT IN AERS: 1.0000 | INHALATION_SPRAY | Freq: Four times a day (QID) | RESPIRATORY_TRACT | Status: DC | PRN

## 2013-07-08 MED ORDER — PREDNISONE (PAK) 10 MG PO TABS: ORAL_TABLET | Freq: Every day | ORAL | Status: DC

## 2013-07-08 MED ORDER — HYDROCODONE-HOMATROPINE 5-1.5 MG/5ML PO SYRP: 5.0000 mL | ORAL_SOLUTION | Freq: Four times a day (QID) | ORAL | Status: DC | PRN

## 2013-07-08 NOTE — ED Notes (Signed)
Sick for the last two weeks with cough, vomiting started yesterday, no abdominal pain.  Pt gagged and vomited.   No fever

## 2013-07-08 NOTE — ED Provider Notes (Signed)
CSN: [id number]     Arrival date & time 07/08/13  1139 History   First MD Initiated Contact with Patient 07/08/13 1156    This chart was scribed for Trixie Dredge PA-C, a non-physician practitioner working with Toy Baker, MD by Lewanda Rife, ED Scribe. This patient was seen in room TR05C/TR05C and the patient's care was started at 12:32 PM     Chief Complaint  Patient presents with  . Cough   (Consider location/radiation/quality/duration/timing/severity/associated sxs/prior Treatment) The history is provided by the patient. No language interpreter was used.   HPI Comments: Candace Liu is a 23 y.o. female who presents to the Emergency Department complaining of worsening intermittent productive cough with clear sputum onset 2 weeks. Reports associated pain in head only with coughing (and occasional baseline headaches that are unchanged secondary of pseudotumors), posttussive vomiting, nasal congestion, and rhinorrhea. Reports trying Dayquil and Nyquil with worsening symptoms. Denies any aggravating factors. Denies associated fever, shortness of breath, otalgia, weakness, neck pain, and numbness.  Past Medical History  Diagnosis Date  . No pertinent past medical history   . Pseudotumor cerebri    Past Surgical History  Procedure Laterality Date  . No past surgeries     No family history on file. History  Substance Use Topics  . Smoking status: Never Smoker   . Smokeless tobacco: Not on file  . Alcohol Use: No   OB History   Grav Para Term Preterm Abortions TAB SAB Ect Mult Living   2 2 2  0 0 0 0 0 0 2     Review of Systems  Constitutional: Negative for fever and chills.  HENT: Positive for congestion and rhinorrhea. Negative for ear pain, sore throat and trouble swallowing.   Eyes: Negative for visual disturbance.  Respiratory: Positive for cough. Negative for shortness of breath.   Cardiovascular: Negative for chest pain.  Gastrointestinal: Positive for vomiting.  Negative for abdominal pain.  Musculoskeletal: Negative for neck pain and neck stiffness.  Neurological: Positive for headaches. Negative for dizziness, weakness and numbness.  Psychiatric/Behavioral: Negative for confusion.    Allergies  Review of patient's allergies indicates no known allergies.  Home Medications   Current Outpatient Rx  Name  Route  Sig  Dispense  Refill  . medroxyPROGESTERone (PROVERA) 5 MG tablet   Oral   Take 2 tablets (10 mg total) by mouth daily.   20 tablet   0   . naproxen sodium (ANAPROX) 220 MG tablet   Oral   Take 440 mg by mouth daily as needed (cramps).          BP 114/68  Pulse 61  Temp(Src) 98.5 F (36.9 C) (Oral)  Resp 20  SpO2 99%  LMP 06/04/2013 Physical Exam  Nursing note and vitals reviewed. Constitutional: She appears well-developed and well-nourished. No distress.  HENT:  Head: Normocephalic and atraumatic.  Mouth/Throat: Uvula is midline and oropharynx is clear and moist. No oropharyngeal exudate, posterior oropharyngeal edema or posterior oropharyngeal erythema.  Eyes: Conjunctivae and EOM are normal.  Neck: Normal range of motion. Neck supple.  Cardiovascular: Normal rate and regular rhythm.   Pulmonary/Chest: Effort normal and breath sounds normal. No stridor. No respiratory distress. She has no wheezes. She has no rales.  Abdominal: Soft. She exhibits no distension. There is no tenderness. There is no rebound and no guarding.  Lymphadenopathy:    She has no cervical adenopathy.  Neurological: She is alert. She exhibits normal muscle tone.  Skin: She is not  diaphoretic.    ED Course  Procedures  COORDINATION OF CARE:  Nursing notes reviewed. Vital signs reviewed. Initial pt interview and examination performed.   12:34 PM-Discussed work up plan with pt at bedside, which includes CXR. Pt agrees with plan.   Treatment plan initiated:Medications - No data to display   Initial diagnostic testing ordered.    Labs  Review Labs Reviewed - No data to display Imaging Review Dg Chest 2 View  07/08/2013   CLINICAL DATA:  Cough, congestion  EXAM: CHEST  2 VIEW  COMPARISON:  07/09/2012  FINDINGS: The heart size and mediastinal contours are within normal limits. Both lungs are clear. The visualized skeletal structures are unremarkable.  IMPRESSION: No active cardiopulmonary disease.   Electronically Signed   By: Christiana PellantGretchen  Green M.D.   On: 07/08/2013 13:40    EKG Interpretation   None       MDM   1. Bronchitis    Pt has been sick for two weeks with cough/cold symptoms.  Afebrile, nontoxic patient with symptoms suggestive of bronchitis.  No concerning findings on exam.  CXR negative. Discharged home with supportive care, PCP follow up.  Discussed result, findings, treatment, and follow up  with patient.  Pt given return precautions.  Pt verbalizes understanding and agrees with plan.      I personally performed the services described in this documentation, which was scribed in my presence. The recorded information has been reviewed and is accurate.   Trixie Dredgemily Allaya Abbasi, PA-C 07/08/13 1517

## 2013-07-08 NOTE — Discharge Instructions (Signed)
Read the information below.  Use the prescribed medication as directed.  Please discuss all new medications with your pharmacist.  You may return to the Emergency Department at any time for worsening condition or any new symptoms that concern you.  If you develop high fevers that do not resolve with tylenol or ibuprofen, develop chest pain or tightness, have difficulty swallowing or breathing, or you are unable to tolerate fluids by mouth, return to the ER for a recheck.     Bronchitis Bronchitis is inflammation of the airways that extend from the windpipe into the lungs (bronchi). The inflammation often causes mucus to develop, which leads to a cough. If the inflammation becomes severe, it may cause shortness of breath. CAUSES  Bronchitis may be caused by:   Viral infections.   Bacteria.   Cigarette smoke.   Allergens, pollutants, and other irritants.  SIGNS AND SYMPTOMS  The most common symptom of bronchitis is a frequent cough that produces mucus. Other symptoms include:  Fever.   Body aches.   Chest congestion.   Chills.   Shortness of breath.   Sore throat.  DIAGNOSIS  Bronchitis is usually diagnosed through a medical history and physical exam. Tests, such as chest X-rays, are sometimes done to rule out other conditions.  TREATMENT  You may need to avoid contact with whatever caused the problem (smoking, for example). Medicines are sometimes needed. These may include:  Antibiotics. These may be prescribed if the condition is caused by bacteria.  Cough suppressants. These may be prescribed for relief of cough symptoms.   Inhaled medicines. These may be prescribed to help open your airways and make it easier for you to breathe.   Steroid medicines. These may be prescribed for those with recurrent (chronic) bronchitis. HOME CARE INSTRUCTIONS  Get plenty of rest.   Drink enough fluids to keep your urine clear or pale yellow (unless you have a medical  condition that requires fluid restriction). Increasing fluids may help thin your secretions and will prevent dehydration.   Only take over-the-counter or prescription medicines as directed by your health care provider.  Only take antibiotics as directed. Make sure you finish them even if you start to feel better.  Avoid secondhand smoke, irritating chemicals, and strong fumes. These will make bronchitis worse. If you are a smoker, quit smoking. Consider using nicotine gum or skin patches to help control withdrawal symptoms. Quitting smoking will help your lungs heal faster.   Put a cool-mist humidifier in your bedroom at night to moisten the air. This may help loosen mucus. Change the water in the humidifier daily. You can also run the hot water in your shower and sit in the bathroom with the door closed for 5 10 minutes.   Follow up with your health care provider as directed.   Wash your hands frequently to avoid catching bronchitis again or spreading an infection to others.  SEEK MEDICAL CARE IF: Your symptoms do not improve after 1 week of treatment.  SEEK IMMEDIATE MEDICAL CARE IF:  Your fever increases.  You have chills.   You have chest pain.   You have worsening shortness of breath.   You have bloody sputum.  You faint.  You have lightheadedness.  You have a severe headache.   You vomit repeatedly. MAKE SURE YOU:   Understand these instructions.  Will watch your condition.  Will get help right away if you are not doing well or get worse. Document Released: 06/01/2005 Document Revised: 03/22/2013 Document  Reviewed: 01/24/2013 [employer] Patient Information 2014 [place], [place].

## 2013-07-08 NOTE — ED Notes (Signed)
Warm blanket given

## 2013-07-14 NOTE — ED Provider Notes (Signed)
Medical screening examination/treatment/procedure(s) were performed by non-physician practitioner and as supervising physician I was immediately available for consultation/collaboration.  Sarim Rothman T Lakena Sparlin, MD 07/14/13 0930 

## 2014-02-22 IMAGING — US US TRANSVAGINAL NON-OB
1 series · 14 of 25 positions shown · non-contrast
Comparison: None.

CLINICAL DATA: Dysfunctional uterine bleeding. Next reveal

EXAM:
TRANSVAGINAL ULTRASOUND OF PELVIS
TECHNIQUE: Transvaginal ultrasound examination of the pelvis was performed
including evaluation of the uterus, ovaries, adnexal regions, and
pelvic cul-de-sac.

[Series 1: us pelvis complete · 14 of 73 slices shown]
[im 1/73]
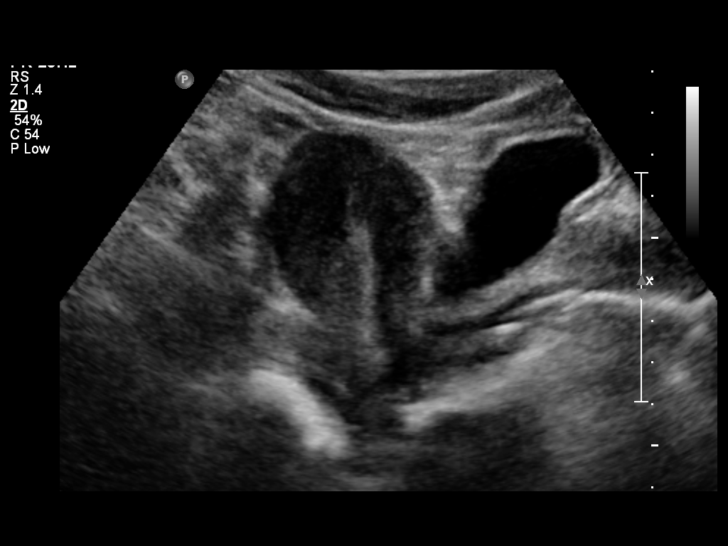
[im 7/73]
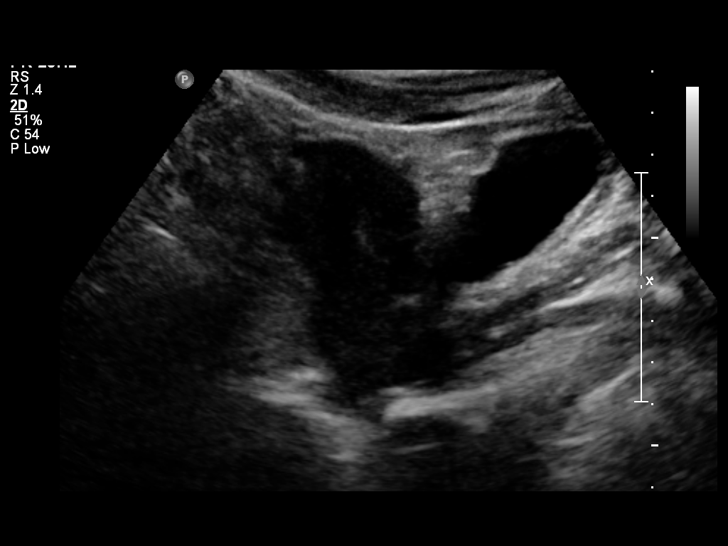
[im 13/73]
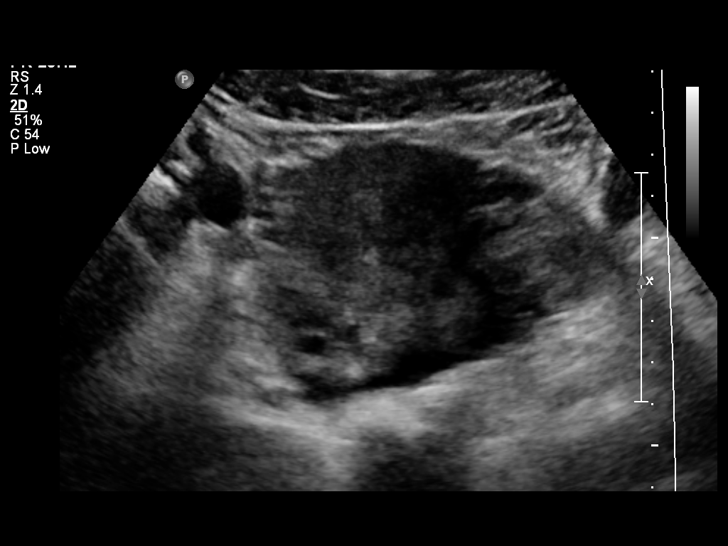
[im 19/73]
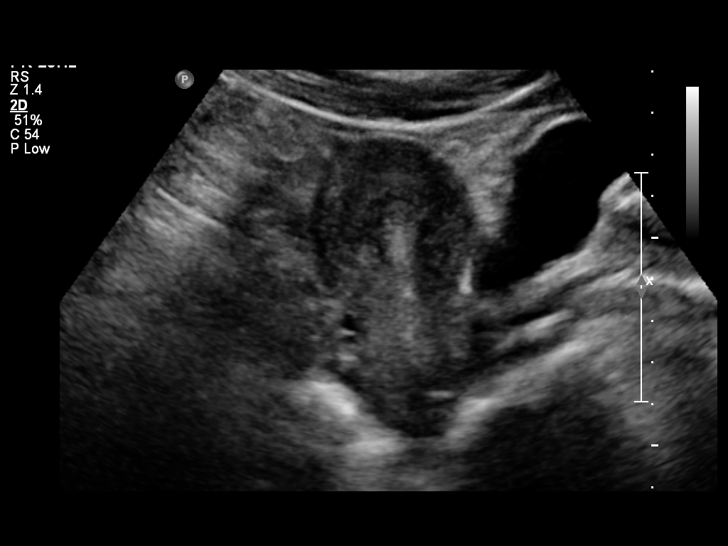
[im 25/73]
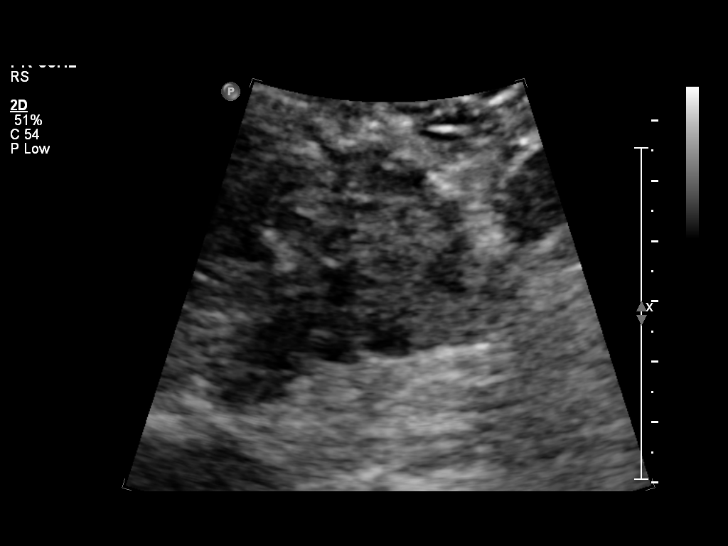
[im 28/73]
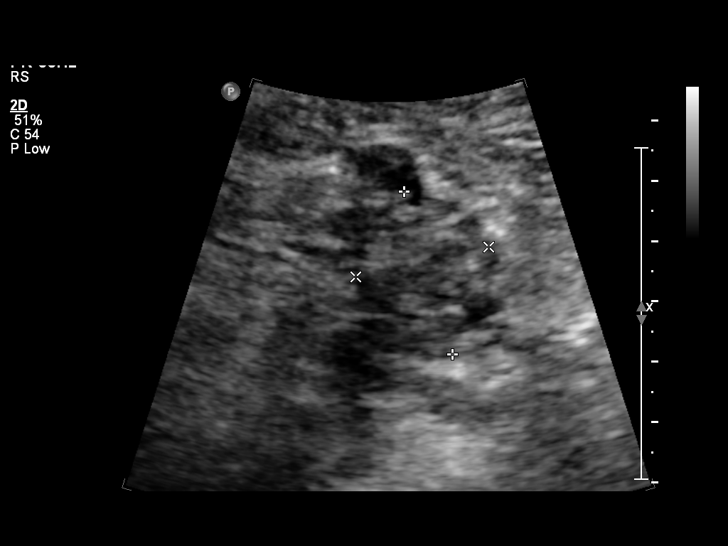
[im 34/73]
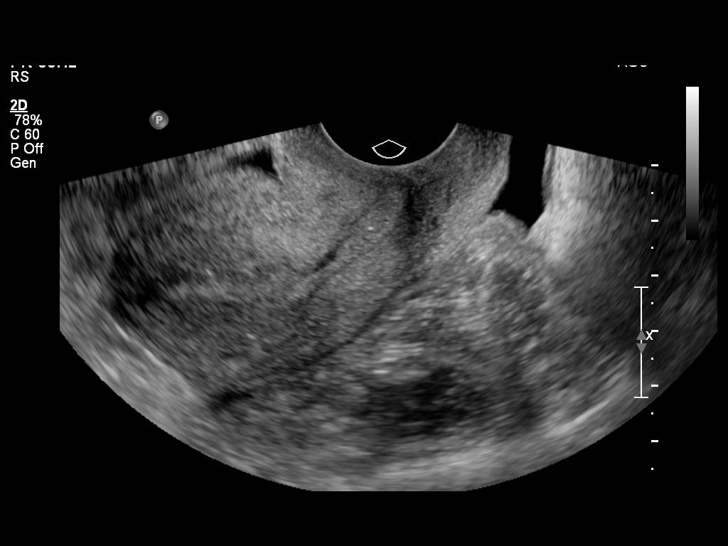
[im 40/73]
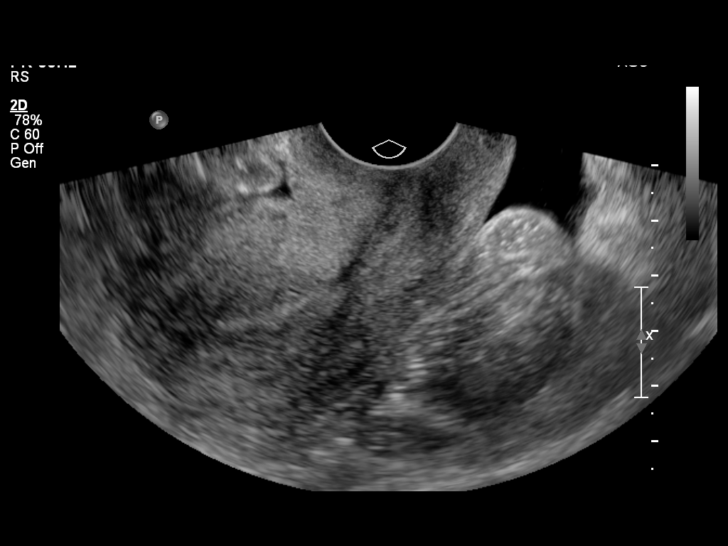
[im 46/73]
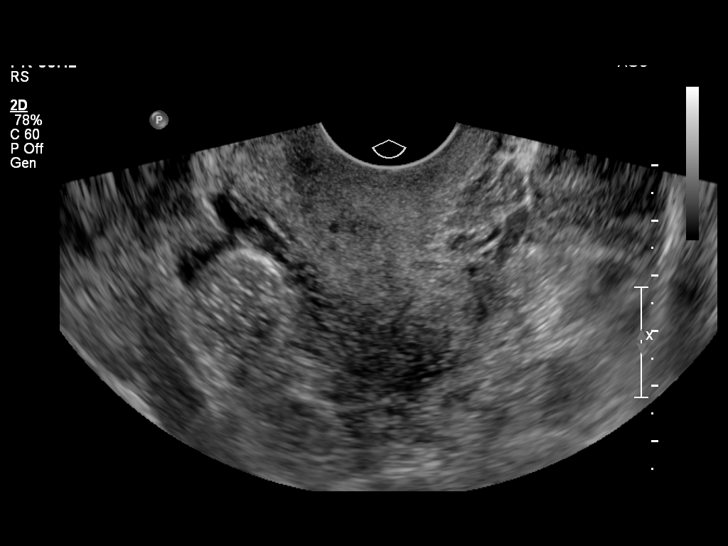
[im 49/73]
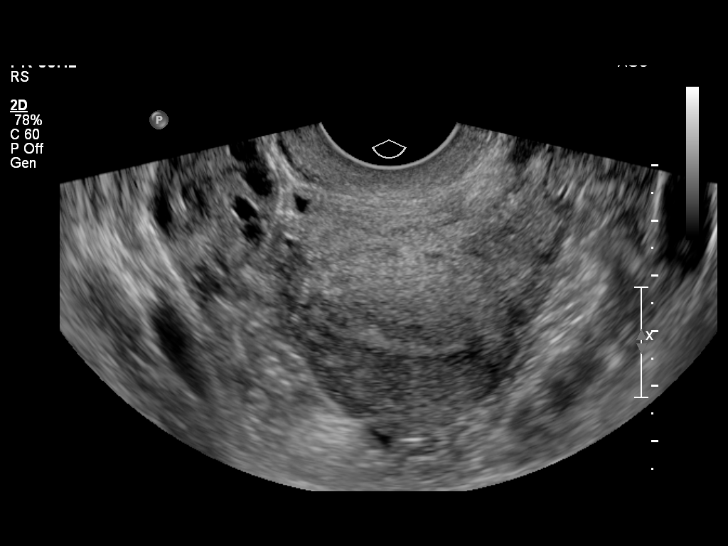
[im 55/73]
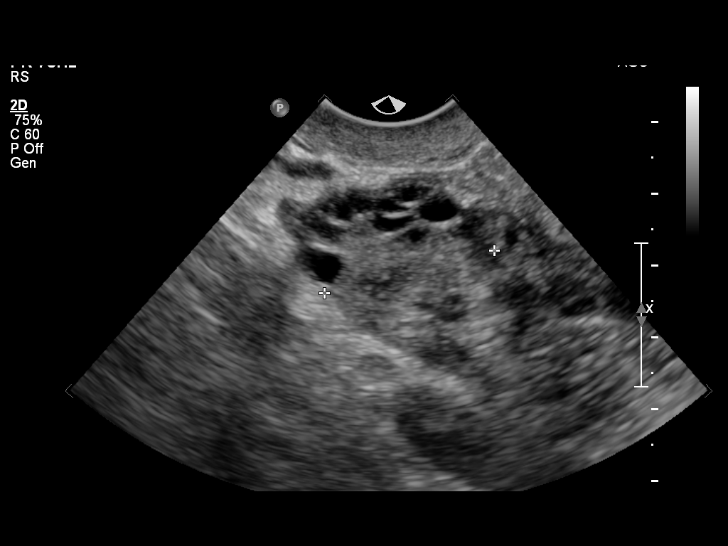
[im 61/73]
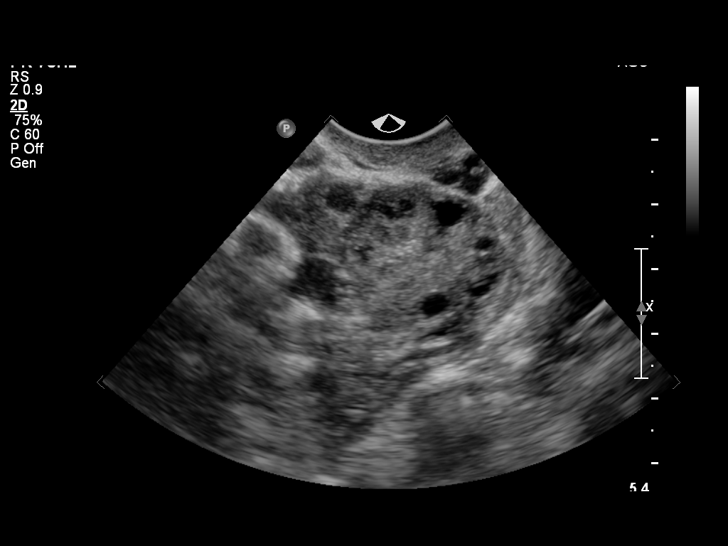
[im 67/73]
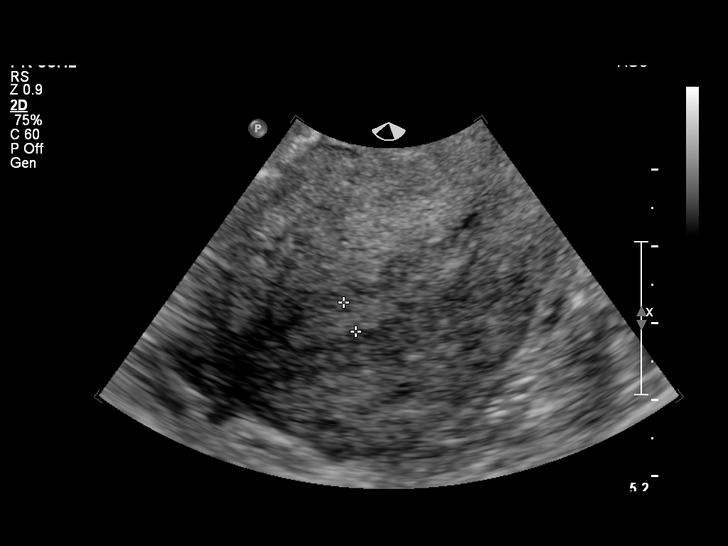
[im 73/73]
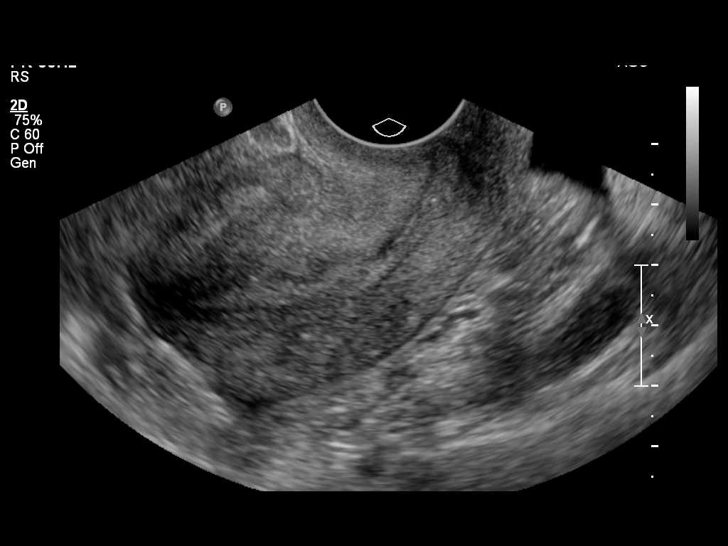

[14 of 25 positions shown; findings below may reference images not displayed]

FINDINGS: Uterus

Measurements: 7.7 x 4 x 4.1 cm.. No fibroids or other mass
visualized.

Endometrium

Thickness: 4.1 mm.  No focal abnormality visualized.

Right ovary

Measurements: 3.2 x 2.1 x 2.4 cm. Normal appearance/no adnexal mass.

Left ovary

Measurements: 2.7 x 2.5 x 2.5 cm. Normal appearance/no adnexal mass.

Other findings: There is a small amount of free fluid in the
cul-de-sac.
IMPRESSION: The uterus and ovaries are normal in appearance. There is a very
small amount of free fluid in the cul-de-sac.

## 2014-04-16 ENCOUNTER — Encounter (HOSPITAL_COMMUNITY): Payer: Self-pay | Admitting: Emergency Medicine

## 2014-04-19 ENCOUNTER — Ambulatory Visit: Payer: Self-pay | Admitting: Cardiology

## 2014-11-13 ENCOUNTER — Telehealth: Payer: Self-pay | Admitting: *Deleted

## 2014-11-13 NOTE — Telephone Encounter (Signed)
Candace Liu called and left a message she thinks she has a yeast infection and wants to know if she can get a medicine for it.  States we can leave her a message. Per chart review she  Has not been seen in clinic before , but has been seen in [facility] several times- last in 2013.  Called Candace Liu and left a message we got your message and would like to see you in the clinic this afternoon or tomorrow afternoon- call us for appointment.

## 2014-11-13 NOTE — Telephone Encounter (Signed)
Candace Liu called front desk and made appointment for tomorrow.

## 2014-11-14 ENCOUNTER — Ambulatory Visit: Payer: Self-pay | Admitting: Obstetrics & Gynecology

## 2014-12-10 ENCOUNTER — Encounter (HOSPITAL_COMMUNITY): Payer: Self-pay | Admitting: *Deleted

## 2014-12-10 ENCOUNTER — Encounter (HOSPITAL_COMMUNITY): Payer: Self-pay | Admitting: Emergency Medicine

## 2014-12-10 ENCOUNTER — Emergency Department (INDEPENDENT_AMBULATORY_CARE_PROVIDER_SITE_OTHER)
Admission: EM | Admit: 2014-12-10 | Discharge: 2014-12-10 | Disposition: A | Discharge status: Home / Self Care | Payer: BLUE CROSS/BLUE SHIELD | Source: Home / Self Care | Attending: Family Medicine | Admitting: Family Medicine

## 2014-12-10 ENCOUNTER — Emergency Department (HOSPITAL_COMMUNITY)
Admission: EM | Admit: 2014-12-10 | Discharge: 2014-12-10 | Discharge status: Against Medical Advice | Payer: BLUE CROSS/BLUE SHIELD | Attending: Emergency Medicine | Admitting: Emergency Medicine

## 2014-12-10 DIAGNOSIS — R1110 Vomiting, unspecified: Secondary | ICD-10-CM | POA: Diagnosis not present

## 2014-12-10 DIAGNOSIS — R51 Headache: Secondary | ICD-10-CM | POA: Diagnosis not present

## 2014-12-10 DIAGNOSIS — R519 Headache, unspecified: Secondary | ICD-10-CM

## 2014-12-10 DIAGNOSIS — R05 Cough: Secondary | ICD-10-CM | POA: Insufficient documentation

## 2014-12-10 DIAGNOSIS — G939 Disorder of brain, unspecified: Secondary | ICD-10-CM | POA: Diagnosis not present

## 2014-12-10 NOTE — ED Notes (Signed)
Headache that started June 25

## 2014-12-10 NOTE — ED Provider Notes (Signed)
CSN: [id number]     Arrival date & time 12/10/14  1852 History   First MD Initiated Contact with Patient 12/10/14 1935     Chief Complaint  Patient presents with  . Headache   (Consider location/radiation/quality/duration/timing/severity/associated sxs/prior Treatment) Patient is a 24 y.o. female presenting with headaches. The history is provided by the patient.  Headache Pain location:  Generalized Quality:  Stabbing Radiates to:  Does not radiate Onset quality:  Gradual Duration:  2 days Progression:  Worsening Chronicity:  New Similar to prior headaches: no   Associated symptoms: nausea, paresthesias and vomiting   Associated symptoms: no blurred vision, no eye pain, no fever, no neck stiffness, no numbness, no seizures and no weakness     Past Medical History  Diagnosis Date  . No pertinent past medical history   . Pseudotumor cerebri    Past Surgical History  Procedure Laterality Date  . No past surgeries     No family history on file. History  Substance Use Topics  . Smoking status: Never Smoker   . Smokeless tobacco: Not on file  . Alcohol Use: No   OB History    Gravida Para Term Preterm AB TAB SAB Ectopic Multiple Living   0 0 0 0 0 0 2     Review of Systems  Constitutional: Negative.  Negative for fever.  Eyes: Negative for blurred vision and pain.  Gastrointestinal: Positive for nausea and vomiting.  Genitourinary: Positive for menstrual problem.  Musculoskeletal: Negative for gait problem and neck stiffness.  Neurological: Positive for headaches and paresthesias. Negative for tremors, seizures, speech difficulty, weakness and numbness.    Allergies  Review of patient's allergies indicates no known allergies.  Home Medications   Prior to Admission medications   Medication Sig Start Date End Date Taking? Authorizing Provider  albuterol (PROVENTIL HFA;VENTOLIN HFA) 108 (90 BASE) MCG/ACT inhaler Inhale 1-2 puffs into the lungs every 6 (six)  hours as needed for wheezing or shortness of breath. 07/08/13   Trixie Dredge, PA-C  HYDROcodone-homatropine [facility]) 5-1.5 MG/5ML syrup Take 5 mLs by mouth every 6 (six) hours as needed for cough. 07/08/13   Trixie Dredge, PA-C  medroxyPROGESTERone (PROVERA) 5 MG tablet Take 2 tablets (10 mg total) by mouth daily. 03/16/13   Hannah Muthersbaugh, PA-C  naproxen sodium (ANAPROX) 220 MG tablet Take 440 mg by mouth daily as needed (cramps).    Historical Provider, MD  predniSONE (STERAPRED UNI-PAK) 10 MG tablet Take by mouth daily. Day 1: take 6 tabs.  Day 2: 5 tabs  Day 3: 4 tabs  Day 4: 3 tabs  Day 5: 2 tabs  Day 6: 1 tab 07/08/13   Trixie Dredge, PA-C   BP 120/79 mmHg  Pulse 70  Temp(Src) 98.5 F (36.9 C) (Oral)  Resp 14  SpO2 100% Physical Exam  Constitutional: She is oriented to person, place, and time. She appears well-developed and well-nourished. No distress.  Eyes: EOM are normal. Pupils are equal, round, and reactive to light.  Neck: Normal range of motion. Neck supple.  Cardiovascular: Regular rhythm.   Lymphadenopathy:    She has no cervical adenopathy.  Neurological: She is alert and oriented to person, place, and time.  Skin: Skin is warm and dry.  Nursing note and vitals reviewed.   ED Course  Procedures (including critical care time) Labs Review Labs Reviewed - No data to display  Imaging Review No results found.   MDM   1. Headache due to  intracranial disease    Pt with h/o pseudotumor , now with 2d HA, generalized ,assoc with vomiting x 2, pt worried, also with leg pains and paresthesias., irreg menses.Linna Hoff,    Jil Penland D Colbin Jovel, MD 12/10/14 2008

## 2014-12-10 NOTE — ED Notes (Signed)
Patient presents with c/o headache, emesis (off and on) and now a cough.  Dx with pseudotumor but this headache is different.

## 2014-12-10 NOTE — ED Notes (Signed)
Urgent Care called and is sending patient to be seen.  Urgent care reported she came there to be seen for a headache and N/V for the past 2 days.  Has a history of pseudotumor and is concerned about the pain  No procedures done at urgent care

## 2014-12-10 NOTE — ED Notes (Signed)
Patient leaving AMA, stating she will come back later.

## 2015-01-07 ENCOUNTER — Encounter: Payer: Self-pay | Admitting: Neurology

## 2015-01-07 ENCOUNTER — Ambulatory Visit (INDEPENDENT_AMBULATORY_CARE_PROVIDER_SITE_OTHER): Payer: BLUE CROSS/BLUE SHIELD | Admitting: Neurology

## 2015-01-07 VITALS — BP 102/68 | HR 68 | Ht 65.0 in | Wt 235.6 lb

## 2015-01-07 DIAGNOSIS — R51 Headache: Secondary | ICD-10-CM

## 2015-01-07 DIAGNOSIS — R519 Headache, unspecified: Secondary | ICD-10-CM | POA: Insufficient documentation

## 2015-01-07 MED ORDER — TRAMADOL HCL 50 MG PO TABS: 50.0000 mg | ORAL_TABLET | Freq: Four times a day (QID) | ORAL | Status: DC | PRN

## 2015-01-07 MED ORDER — TOPIRAMATE 25 MG PO TABS: ORAL_TABLET | ORAL | Status: DC

## 2015-01-07 NOTE — Patient Instructions (Addendum)
Topamax (topiramate) is a seizure medication that has an FDA approval for seizures and for migraine headache. Potential side effects of this medication include weight loss, cognitive slowing, tingling in the fingers and toes, and carbonated drinks will taste bad. If any significant side effects are noted on this drug, please contact our office.  We will start a medication called Topamax for the headache. I will give you Ultram for pain if needed. If you are not getting better within the next 3 weeks, please contact our office, we will pursue further workup to include a head scan and lumbar puncture (spinal tap).  Otherwise, we'll follow-up in about 3 months.  Headaches, Frequently Asked Questions MIGRAINE HEADACHES Q: What is migraine? What causes it? How can I treat it? A: Generally, migraine headaches begin as a dull ache. Then they develop into a constant, throbbing, and pulsating pain. You may experience pain at the temples. You may experience pain at the front or back of one or both sides of the head. The pain is usually accompanied by a combination of:  Nausea.  Vomiting.  Sensitivity to light and noise. Some people (about 15%) experience an aura (see below) before an attack. The cause of migraine is believed to be chemical reactions in the brain. Treatment for migraine may include over-the-counter or prescription medications. It may also include self-help techniques. These include relaxation training and biofeedback.  Q: What is an aura? A: About 15% of people with migraine get an "aura". This is a sign of neurological symptoms that occur before a migraine headache. You may see wavy or jagged lines, dots, or flashing lights. You might experience tunnel vision or blind spots in one or both eyes. The aura can include visual or auditory hallucinations (something imagined). It may include disruptions in smell (such as strange odors), taste or touch. Other symptoms include:  Numbness.  A  "pins and needles" sensation.  Difficulty in recalling or speaking the correct word. These neurological events may last as long as 60 minutes. These symptoms will fade as the headache begins. Q: What is a trigger? A: Certain physical or environmental factors can lead to or "trigger" a migraine. These include:  Foods.  Hormonal changes.  Weather.  Stress. It is important to remember that triggers are different for everyone. To help prevent migraine attacks, you need to figure out which triggers affect you. Keep a headache diary. This is a good way to track triggers. The diary will help you talk to your healthcare professional about your condition. Q: Does weather affect migraines? A: Bright sunshine, hot, humid conditions, and drastic changes in barometric pressure may lead to, or "trigger," a migraine attack in some people. But studies have shown that weather does not act as a trigger for everyone with migraines. Q: What is the link between migraine and hormones? A: Hormones start and regulate many of your body's functions. Hormones keep your body in balance within a constantly changing environment. The levels of hormones in your body are unbalanced at times. Examples are during menstruation, pregnancy, or menopause. That can lead to a migraine attack. In fact, about three quarters of all women with migraine report that their attacks are related to the menstrual cycle.  Q: Is there an increased risk of stroke for migraine sufferers? A: The likelihood of a migraine attack causing a stroke is very remote. That is not to say that migraine sufferers cannot have a stroke associated with their migraines. In persons under age 52, the  most common associated factor for stroke is migraine headache. But over the course of a person's normal life span, the occurrence of migraine headache may actually be associated with a reduced risk of dying from cerebrovascular disease due to stroke.  Q: What are acute  medications for migraine? A: Acute medications are used to treat the pain of the headache after it has started. Examples over-the-counter medications, NSAIDs, ergots, and triptans.  Q: What are the triptans? A: Triptans are the newest class of abortive medications. They are specifically targeted to treat migraine. Triptans are vasoconstrictors. They moderate some chemical reactions in the brain. The triptans work on receptors in your brain. Triptans help to restore the balance of a neurotransmitter called serotonin. Fluctuations in levels of serotonin are thought to be a main cause of migraine.  Q: Are over-the-counter medications for migraine effective? A: Over-the-counter, or "OTC," medications may be effective in relieving mild to moderate pain and associated symptoms of migraine. But you should see your caregiver before beginning any treatment regimen for migraine.  Q: What are preventive medications for migraine? A: Preventive medications for migraine are sometimes referred to as "prophylactic" treatments. They are used to reduce the frequency, severity, and length of migraine attacks. Examples of preventive medications include antiepileptic medications, antidepressants, beta-blockers, calcium channel blockers, and NSAIDs (nonsteroidal anti-inflammatory drugs). Q: Why are anticonvulsants used to treat migraine? A: During the past few years, there has been an increased interest in antiepileptic drugs for the prevention of migraine. They are sometimes referred to as "anticonvulsants". Both epilepsy and migraine may be caused by similar reactions in the brain.  Q: Why are antidepressants used to treat migraine? A: Antidepressants are typically used to treat people with depression. They may reduce migraine frequency by regulating chemical levels, such as serotonin, in the brain.  Q: What alternative therapies are used to treat migraine? A: The term "alternative therapies" is often used to describe  treatments considered outside the scope of conventional Western medicine. Examples of alternative therapy include acupuncture, acupressure, and yoga. Another common alternative treatment is herbal therapy. Some herbs are believed to relieve headache pain. Always discuss alternative therapies with your caregiver before proceeding. Some herbal products contain arsenic and other toxins. TENSION HEADACHES Q: What is a tension-type headache? What causes it? How can I treat it? A: Tension-type headaches occur randomly. They are often the result of temporary stress, anxiety, fatigue, or anger. Symptoms include soreness in your temples, a tightening band-like sensation around your head (a "vice-like" ache). Symptoms can also include a pulling feeling, pressure sensations, and contracting head and neck muscles. The headache begins in your forehead, temples, or the back of your head and neck. Treatment for tension-type headache may include over-the-counter or prescription medications. Treatment may also include self-help techniques such as relaxation training and biofeedback. CLUSTER HEADACHES Q: What is a cluster headache? What causes it? How can I treat it? A: Cluster headache gets its name because the attacks come in groups. The pain arrives with little, if any, warning. It is usually on one side of the head. A tearing or bloodshot eye and a runny nose on the same side of the headache may also accompany the pain. Cluster headaches are believed to be caused by chemical reactions in the brain. They have been described as the most severe and intense of any headache type. Treatment for cluster headache includes prescription medication and oxygen. SINUS HEADACHES Q: What is a sinus headache? What causes it? How can I treat it?  A: When a cavity in the bones of the face and skull (a sinus) becomes inflamed, the inflammation will cause localized pain. This condition is usually the result of an allergic reaction, a tumor,  or an infection. If your headache is caused by a sinus blockage, such as an infection, you will probably have a fever. An x-ray will confirm a sinus blockage. Your caregiver's treatment might include antibiotics for the infection, as well as antihistamines or decongestants.  REBOUND HEADACHES Q: What is a rebound headache? What causes it? How can I treat it? A: A pattern of taking acute headache medications too often can lead to a condition known as "rebound headache." A pattern of taking too much headache medication includes taking it more than 2 days per week or in excessive amounts. That means more than the label or a caregiver advises. With rebound headaches, your medications not only stop relieving pain, they actually begin to cause headaches. Doctors treat rebound headache by tapering the medication that is being overused. Sometimes your caregiver will gradually substitute a different type of treatment or medication. Stopping may be a challenge. Regularly overusing a medication increases the potential for serious side effects. Consult a caregiver if you regularly use headache medications more than 2 days per week or more than the label advises. ADDITIONAL QUESTIONS AND ANSWERS Q: What is biofeedback? A: Biofeedback is a self-help treatment. Biofeedback uses special equipment to monitor your body's involuntary physical responses. Biofeedback monitors:  Breathing.  Pulse.  Heart rate.  Temperature.  Muscle tension.  Brain activity. Biofeedback helps you refine and perfect your relaxation exercises. You learn to control the physical responses that are related to stress. Once the technique has been mastered, you do not need the equipment any more. Q: Are headaches hereditary? A: Four out of five (80%) of people that suffer report a family history of migraine. Scientists are not sure if this is genetic or a family predisposition. Despite the uncertainty, a child has a 50% chance of having  migraine if one parent suffers. The child has a 75% chance if both parents suffer.  Q: Can children get headaches? A: By the time they reach high school, most young people have experienced some type of headache. Many safe and effective approaches or medications can prevent a headache from occurring or stop it after it has begun.  Q: What type of doctor should I see to diagnose and treat my headache? A: Start with your primary caregiver. Discuss his or her experience and approach to headaches. Discuss methods of classification, diagnosis, and treatment. Your caregiver may decide to recommend you to a headache specialist, depending upon your symptoms or other physical conditions. Having diabetes, allergies, etc., may require a more comprehensive and inclusive approach to your headache. The National Headache Foundation will provide, upon request, a list of [facility] physician members in your state. Document Released: 08/22/2003 Document Revised: 08/24/2011 Document Reviewed: 01/30/2008 [facility] Patient Information 2015 [place], [place]. This information is not intended to replace advice given to you by your health care provider. Make sure you discuss any questions you have with your health care provider.

## 2015-01-07 NOTE — Progress Notes (Signed)
Reason for visit: Headache  Referring physician: Dr. Rosario Adie Candace Liu is a 24 y.o. female  History of present illness:  Candace Liu is a 24 year old right-handed black female with a history of obesity who gives a 7 year history of pseudotumor cerebri. The patient was treated and diagnosed in [place], [place]. She indicates that she had a head scan, and was set up for lumbar puncture which confirmed the diagnosis. She was placed on Diamox, but she indicated that her headaches completely resolved within a couple months, and she stopped the medication. The patient was also having episodes of syncope prior to the diagnosis. She has done fairly well with her headaches over the last 7 years, the patient may have on average one headache a week, but in general she was doing well. Two months ago, the headaches converted to being daily in nature. The headaches are mainly in the frontal regions bilaterally, and may be associated with blurred vision and nausea and vomiting. The patient denies any muffled hearing or neck stiffness. She has some tingling sensations in the hands on occasion. She was pregnant 3 years ago, and she gained about 50 pounds with the pregnancy, and she has not lost the weight. The patient denies any balance issues or difficulty controlling the bowels or the bladder. She denies weakness of the extremities. The patient is working, she occasionally may have to miss work because of the headache if she has nausea. She is sent to this office for an evaluation.  Past Medical History  Diagnosis Date  . No pertinent past medical history   . Pseudotumor cerebri   . Headache   . Obesity     Past Surgical History  Procedure Laterality Date  . No past surgeries      Family History  Problem Relation Age of Onset  . Heart disease Brother     MI  . Healthy Mother   . Healthy Father   . Migraines Neg Hx     Social history:  reports that she has never smoked. She has never used  smokeless tobacco. She reports that she drinks alcohol. She reports that she does not use illicit drugs.  Medications:  Prior to Admission medications   Medication Sig Start Date End Date Taking? Authorizing Provider  UNABLE TO FIND Med Name: green tea vitamins   Yes Historical Provider, MD     No Known Allergies  ROS:  Out of a complete 14 system review of symptoms, the patient complains only of the following symptoms, and all other reviewed systems are negative.  Swelling in the legs Dizziness Moles Joint pain, achy muscles Headache, dizziness  Blood pressure 102/68, pulse 68, height 5\' 5"  (1.651 m), weight 235 lb 9.6 oz (106.867 kg), last menstrual period 10/10/2014.  Physical Exam  General: The patient is alert and cooperative at the time of the examination. The patient is markedly obese.  Eyes: Pupils are equal, round, and reactive to light. Discs are flat bilaterally. No venous pulsations were seen.  Neck: The neck is supple, no carotid bruits are noted.  Respiratory: The respiratory examination is clear.  Cardiovascular: The cardiovascular examination reveals a regular rate and rhythm, no obvious murmurs or rubs are noted.  Neuromuscular: Range of movement of the cervical spine was full. No crepitus was noted in the temporomandibular joints.  Skin: Extremities are without significant edema.  Neurologic Exam  Mental status: The patient is alert and oriented x 3 at the time of the examination.  The patient has apparent normal recent and remote memory, with an apparently normal attention span and concentration ability.  Cranial nerves: Facial symmetry is present. There is good sensation of the face to pinprick and soft touch bilaterally. The strength of the facial muscles and the muscles to head turning and shoulder shrug are normal bilaterally. Speech is well enunciated, no aphasia or dysarthria is noted. Extraocular movements are full. Visual fields are full. The tongue  is midline, and the patient has symmetric elevation of the soft palate. No obvious hearing deficits are noted.  Motor: The motor testing reveals 5 over 5 strength of all 4 extremities. Good symmetric motor tone is noted throughout.  Sensory: Sensory testing is intact to pinprick, soft touch, vibration sensation, and position sense on all 4 extremities. No evidence of extinction is noted.  Coordination: Cerebellar testing reveals good finger-nose-finger and heel-to-shin bilaterally.  Gait and station: Gait is normal. Tandem gait is normal. Romberg is negative. No drift is seen.  Reflexes: Deep tendon reflexes are symmetric and normal bilaterally. Toes are downgoing bilaterally.   Assessment/Plan:  1. History of pseudotumor cerebri  2. Daily headache  The patient currently has converted to having daily headaches. The headaches may be associated with nausea and vomiting. I see no evidence of papilledema on clinical exam, but venous pulsations are not seen either. The patient may have migraine headaches, but pseudotumor cerebri is still a possibility. The patient will be placed on Topamax therapy, if this is not effective, we may pursue a CT of the head and lumbar puncture. The patient was given Ultram to take if needed for pain. She will follow-up in 3 months, sooner if needed. The patient will contact me within 3 weeks if the headaches are not improving.  Marlan Palau MD 01/07/2015 7:29 PM  [facility] [address] [place], [place] [address]  Phone [phone] Fax [phone]

## 2016-02-17 ENCOUNTER — Inpatient Hospital Stay (HOSPITAL_COMMUNITY): Payer: BLUE CROSS/BLUE SHIELD

## 2016-02-17 ENCOUNTER — Encounter (HOSPITAL_COMMUNITY): Payer: Self-pay | Admitting: *Deleted

## 2016-02-17 ENCOUNTER — Inpatient Hospital Stay (HOSPITAL_COMMUNITY)
Admission: AD | Admit: 2016-02-17 | Discharge: 2016-02-17 | Disposition: A | Discharge status: Home / Self Care | Payer: BLUE CROSS/BLUE SHIELD | Source: Ambulatory Visit | Attending: Obstetrics & Gynecology | Admitting: Obstetrics & Gynecology

## 2016-02-17 DIAGNOSIS — Z3A01 Less than 8 weeks gestation of pregnancy: Secondary | ICD-10-CM | POA: Insufficient documentation

## 2016-02-17 DIAGNOSIS — O4691 Antepartum hemorrhage, unspecified, first trimester: Secondary | ICD-10-CM | POA: Diagnosis not present

## 2016-02-17 DIAGNOSIS — O209 Hemorrhage in early pregnancy, unspecified: Secondary | ICD-10-CM | POA: Insufficient documentation

## 2016-02-17 LAB — URINE MICROSCOPIC-ADD ON

## 2016-02-17 LAB — WET PREP, GENITAL
Clue Cells Wet Prep HPF POC: NONE SEEN
Sperm: NONE SEEN
Trich, Wet Prep: NONE SEEN
YEAST WET PREP: NONE SEEN

## 2016-02-17 LAB — URINALYSIS, ROUTINE W REFLEX MICROSCOPIC
Bilirubin Urine: NEGATIVE
GLUCOSE, UA: NEGATIVE mg/dL
KETONES UR: NEGATIVE mg/dL
Leukocytes, UA: NEGATIVE
Nitrite: NEGATIVE
PH: 5.5 (ref 5.0–8.0)
Protein, ur: NEGATIVE mg/dL
Specific Gravity, Urine: >1.03 — ABNORMAL HIGH (ref 1.005–1.030)

## 2016-02-17 LAB — POCT PREGNANCY, URINE: Preg Test, Ur: POSITIVE — AB

## 2016-02-17 NOTE — MAU Note (Signed)
Pt states she took a home pregnancy test and it was positive.  Pt states that she has been spotting for a couple days and wants to know how far along she is.

## 2016-02-17 NOTE — Discharge Instructions (Signed)
Pelvic Rest °Pelvic rest is sometimes recommended for women when:  °· The placenta is partially or completely covering the opening of the cervix (placenta previa). °· There is bleeding between the uterine wall and the amniotic sac in the first trimester (subchorionic hemorrhage). °· The cervix begins to open without labor starting (incompetent cervix, cervical insufficiency). °· The labor is too early (preterm labor). °HOME CARE INSTRUCTIONS °· Do not have sexual intercourse, stimulation, or an orgasm. °· Do not use tampons, douche, or put anything in the vagina. °· Do not lift anything over 10 pounds (4.5 kg). °· Avoid strenuous activity or straining your pelvic muscles. °SEEK MEDICAL CARE IF:  °· You have any vaginal bleeding during pregnancy. Treat this as a potential emergency. °· You have cramping pain felt low in the stomach (stronger than menstrual cramps). °· You notice vaginal discharge (watery, mucus, or bloody). °· You have a low, dull backache. °· There are regular contractions or uterine tightening. °SEEK IMMEDIATE MEDICAL CARE IF: °You have vaginal bleeding and have placenta previa.  °  °This information is not intended to replace advice given to you by your health care provider. Make sure you discuss any questions you have with your health care provider. °  °Document Released: 09/26/2010 Document Revised: 08/24/2011 Document Reviewed: 12/03/2014 °Elsevier Interactive Patient Education ©2016 [employer] ° °

## 2016-02-17 NOTE — MAU Provider Note (Signed)
History     CSN: [id number]  Arrival date and time: 02/17/16 1155   None     Chief Complaint  Patient presents with  . Vaginal Bleeding   HPI   Ms.Candace Liu is a 25 y.o. female G3P2002 @ [redacted]w[redacted]d here with vaginal spotting, the spotting is very light. This started several weeks ago. She had a positive pregnancy test Friday.  The spotting is described as pink in color. She is very concerned about her due date, and how far along she is. She denies pain currently.   OB History    Gravida Para Term Preterm AB Living   3 2 2  0 0 2   SAB TAB Ectopic Multiple Live Births   0 0 0 0 2      Past Medical History:  Diagnosis Date  . Headache   . No pertinent past medical history   . Obesity   . Pseudotumor cerebri     Past Surgical History:  Procedure Laterality Date  . NO PAST SURGERIES      Family History  Problem Relation Age of Onset  . Heart disease Brother     MI  . Healthy Mother   . Healthy Father   . Migraines Neg Hx     Social History  Substance Use Topics  . Smoking status: Never Smoker  . Smokeless tobacco: Never Used  . Alcohol use Yes     Comment: socially    Allergies: No Known Allergies  Prescriptions Prior to Admission  Medication Sig Dispense Refill Last Dose  . topiramate (TOPAMAX) 25 MG tablet One tablet at night for one week, then take 2 tablets at night for 1 week, then take 3 tablets at night (Patient not taking: Reported on 02/17/2016) 90 tablet 3 Not Taking at Unknown time  . traMADol (ULTRAM) 50 MG tablet Take 1 tablet (50 mg total) by mouth every 6 (six) hours as needed. (Patient not taking: Reported on 02/17/2016) 40 tablet 1 Not Taking at Unknown time   Recent Results (from the past 2160 hour(s))  Urinalysis, Routine w reflex microscopic (not at [facility])     Status: Abnormal   Collection Time: 02/17/16 12:00 PM  Result Value Ref Range   Color, Urine YELLOW YELLOW   APPearance CLEAR CLEAR   Specific Gravity, Urine >1.030 (H) 1.005 - 1.030    pH 5.5 5.0 - 8.0   Glucose, UA NEGATIVE NEGATIVE mg/dL   Hgb urine dipstick TRACE (A) NEGATIVE   Bilirubin Urine NEGATIVE NEGATIVE   Ketones, ur NEGATIVE NEGATIVE mg/dL   Protein, ur NEGATIVE NEGATIVE mg/dL   Nitrite NEGATIVE NEGATIVE   Leukocytes, UA NEGATIVE NEGATIVE  Urine microscopic-add on     Status: Abnormal   Collection Time: 02/17/16 12:00 PM  Result Value Ref Range   Squamous Epithelial / LPF 6-30 (A) NONE SEEN   WBC, UA 0-5 0 - 5 WBC/hpf   RBC / HPF 0-5 0 - 5 RBC/hpf   Bacteria, UA FEW (A) NONE SEEN   Urine-Other MUCOUS PRESENT   Pregnancy, urine POC     Status: Abnormal   Collection Time: 02/17/16 12:16 PM  Result Value Ref Range   Preg Test, Ur POSITIVE (A) NEGATIVE    Comment:        THE SENSITIVITY OF THIS METHODOLOGY IS >24 mIU/mL   Wet prep, genital     Status: Abnormal   Collection Time: 02/17/16 12:45 PM  Result Value Ref Range   Yeast Wet Prep HPF POC  NONE SEEN NONE SEEN   Trich, Wet Prep NONE SEEN NONE SEEN   Clue Cells Wet Prep HPF POC NONE SEEN NONE SEEN   WBC, Wet Prep HPF POC MODERATE (A) NONE SEEN    Comment: FEW BACTERIA SEEN   Sperm NONE SEEN    US Ob Comp Less 14 Wks  Result Date: 02/17/2016 CLINICAL DATA:  Vaginal bleeding in first trimester pregnancy. Gestational age by LMP of 6 weeks 4 days. EXAM: OBSTETRIC <14 WK [place] AND TRANSVAGINAL OB US TECHNIQUE: Both transabdominal and transvaginal ultrasound examinations were performed for complete evaluation of the gestation as well as the maternal uterus, adnexal regions, and pelvic cul-de-sac. Transvaginal technique was performed to assess early pregnancy. COMPARISON:  None. FINDINGS: Intrauterine gestational sac: Single Yolk sac:  Visualized Embryo:  Visualized Cardiac Activity: Visualized Heart Rate: 119  bpm CRL:  6  mm   6 w   2 d                  [place] EDC: 10/10/2016 Subchorionic hemorrhage:  None visualized. Maternal uterus/adnexae: Small left ovarian corpus luteum noted. Normal appearance of left ovary.  No adnexal mass or abnormal free fluid seen. IMPRESSION: Single living IUP measuring 6 weeks 2 days with [place] EDC of 10/10/2016. No significant maternal uterine or adnexal abnormality identified. Electronically Signed   By: Myles Rosenthal M.D.   On: 02/17/2016 14:24   US Ob Transvaginal  Result Date: 02/17/2016 CLINICAL DATA:  Vaginal bleeding in first trimester pregnancy. Gestational age by LMP of 6 weeks 4 days. EXAM: OBSTETRIC <14 WK [place] AND TRANSVAGINAL OB US TECHNIQUE: Both transabdominal and transvaginal ultrasound examinations were performed for complete evaluation of the gestation as well as the maternal uterus, adnexal regions, and pelvic cul-de-sac. Transvaginal technique was performed to assess early pregnancy. COMPARISON:  None. FINDINGS: Intrauterine gestational sac: Single Yolk sac:  Visualized Embryo:  Visualized Cardiac Activity: Visualized Heart Rate: 119  bpm CRL:  6  mm   6 w   2 d                  [place] EDC: 10/10/2016 Subchorionic hemorrhage:  None visualized. Maternal uterus/adnexae: Small left ovarian corpus luteum noted. Normal appearance of left ovary. No adnexal mass or abnormal free fluid seen. IMPRESSION: Single living IUP measuring 6 weeks 2 days with [place] EDC of 10/10/2016. No significant maternal uterine or adnexal abnormality identified. Electronically Signed   By: Myles Rosenthal M.D.   On: 02/17/2016 14:24    Review of Systems  Constitutional: Positive for chills. Negative for fever.  Gastrointestinal: Positive for nausea (Not on meds ). Negative for abdominal pain (No pain currenlty ), constipation, diarrhea, heartburn and vomiting.  Genitourinary: Negative for dysuria and urgency.  Musculoskeletal: Negative for back pain.   Physical Exam   Blood pressure 127/63, pulse 85, temperature 97.5 F (36.4 C), temperature source Oral, resp. rate 16, height 5\' 4"  (1.626 m), weight 230 lb 9.6 oz (104.6 kg), last menstrual period 01/02/2016.  Physical Exam  Constitutional: She is oriented  to person, place, and time. She appears well-developed and well-nourished.  HENT:  Head: Normocephalic.  Eyes: Pupils are equal, round, and reactive to light.  GI: Soft. She exhibits no distension. There is no tenderness. There is no rebound.  Genitourinary:  Genitourinary Comments: Vagina - Small amount of brown vaginal discharge, no odor  Cervix - No contact bleeding, no active bleeding  Bimanual exam: Cervix closed, no CMT  Uterus non tender,  normal size Adnexa non tender, no masses bilaterally GC/Chlam, wet prep done Chaperone present for exam.   Musculoskeletal: Normal range of motion.  Neurological: She is alert and oriented to person, place, and time.  Skin: Skin is warm. She is not diaphoretic.  Psychiatric: Her behavior is normal.    MAU Course  Procedures  None  MDM  US B positive blood type   Assessment and Plan   A:  1. Vaginal bleeding in pregnancy, first trimester   2. Vaginal bleeding in pregnancy, first trimester     P:  Discharge home in stable condition  Follow up with OB of choice; start prenatal care Prenatal vitamins daily  Bleeding precautions Pelvic rest Return to MAU if symptoms worsen    Duane LopeJennifer I Kathreen Dileo, NP 02/17/2016 4:07 PM

## 2016-02-18 LAB — GC/CHLAMYDIA PROBE AMP (~~LOC~~) NOT AT ARMC
Chlamydia: NEGATIVE
NEISSERIA GONORRHEA: NEGATIVE

## 2016-03-17 ENCOUNTER — Other Ambulatory Visit (HOSPITAL_COMMUNITY)
Admission: RE | Admit: 2016-03-17 | Discharge: 2016-03-17 | Disposition: A | Discharge status: Home / Self Care | Payer: Medicaid Other | Source: Ambulatory Visit | Attending: Obstetrics and Gynecology | Admitting: Obstetrics and Gynecology

## 2016-03-17 ENCOUNTER — Ambulatory Visit (INDEPENDENT_AMBULATORY_CARE_PROVIDER_SITE_OTHER): Payer: BLUE CROSS/BLUE SHIELD | Admitting: Obstetrics and Gynecology

## 2016-03-17 ENCOUNTER — Encounter: Payer: Self-pay | Admitting: *Deleted

## 2016-03-17 VITALS — BP 136/81 | HR 99 | Temp 98.4°F | Wt 239.2 lb

## 2016-03-17 DIAGNOSIS — Z3401 Encounter for supervision of normal first pregnancy, first trimester: Secondary | ICD-10-CM

## 2016-03-17 DIAGNOSIS — Z113 Encounter for screening for infections with a predominantly sexual mode of transmission: Secondary | ICD-10-CM | POA: Insufficient documentation

## 2016-03-17 DIAGNOSIS — Z23 Encounter for immunization: Secondary | ICD-10-CM

## 2016-03-17 DIAGNOSIS — Z01419 Encounter for gynecological examination (general) (routine) without abnormal findings: Secondary | ICD-10-CM | POA: Insufficient documentation

## 2016-03-17 NOTE — Progress Notes (Signed)
Patient is in office for initial ob visit, states no concerns.

## 2016-03-17 NOTE — Patient Instructions (Signed)
First Trimester of Pregnancy The first trimester of pregnancy is from week 1 until the end of week 12 (months 1 through 3). A week after a sperm fertilizes an egg, the egg will implant on the wall of the uterus. This embryo will begin to develop into a baby. Genes from you and your partner are forming the baby. The female genes determine whether the baby is a boy or a girl. At 6-8 weeks, the eyes and face are formed, and the heartbeat can be seen on ultrasound. At the end of 12 weeks, all the baby's organs are formed.  Now that you are pregnant, you will want to do everything you can to have a healthy baby. Two of the most important things are to get good prenatal care and to follow your health care provider's instructions. Prenatal care is all the medical care you receive before the baby's birth. This care will help prevent, find, and treat any problems during the pregnancy and childbirth. BODY CHANGES Your body goes through many changes during pregnancy. The changes vary from woman to woman.   You may gain or lose a couple of pounds at first.  You may feel sick to your stomach (nauseous) and throw up (vomit). If the vomiting is uncontrollable, call your health care provider.  You may tire easily.  You may develop headaches that can be relieved by medicines approved by your health care provider.  You may urinate more often. Painful urination may mean you have a bladder infection.  You may develop heartburn as a result of your pregnancy.  You may develop constipation because certain hormones are causing the muscles that push waste through your intestines to slow down.  You may develop hemorrhoids or swollen, bulging veins (varicose veins).  Your breasts may begin to grow larger and become tender. Your nipples may stick out more, and the tissue that surrounds them (areola) may become darker.  Your gums may bleed and may be sensitive to brushing and flossing.  Dark spots or blotches (chloasma,  mask of pregnancy) may develop on your face. This will likely fade after the baby is born.  Your menstrual periods will stop.  You may have a loss of appetite.  You may develop cravings for certain kinds of food.  You may have changes in your emotions from day to day, such as being excited to be pregnant or being concerned that something may go wrong with the pregnancy and baby.  You may have more vivid and strange dreams.  You may have changes in your hair. These can include thickening of your hair, rapid growth, and changes in texture. Some women also have hair loss during or after pregnancy, or hair that feels dry or thin. Your hair will most likely return to normal after your baby is born. WHAT TO EXPECT AT YOUR PRENATAL VISITS During a routine prenatal visit:  You will be weighed to make sure you and the baby are growing normally.  Your blood pressure will be taken.  Your abdomen will be measured to track your baby's growth.  The fetal heartbeat will be listened to starting around week 10 or 12 of your pregnancy.  Test results from any previous visits will be discussed. Your health care provider may ask you:  How you are feeling.  If you are feeling the baby move.  If you have had any abnormal symptoms, such as leaking fluid, bleeding, severe headaches, or abdominal cramping.  If you are using any tobacco products,   including cigarettes, chewing tobacco, and electronic cigarettes.  If you have any questions. Other tests that may be performed during your first trimester include:  Blood tests to find your blood type and to check for the presence of any previous infections. They will also be used to check for low iron levels (anemia) and Rh antibodies. Later in the pregnancy, blood tests for diabetes will be done along with other tests if problems develop.  Urine tests to check for infections, diabetes, or protein in the urine.  An ultrasound to confirm the proper growth  and development of the baby.  An amniocentesis to check for possible genetic problems.  Fetal screens for spina bifida and Down syndrome.  You may need other tests to make sure you and the baby are doing well.  HIV (human immunodeficiency virus) testing. Routine prenatal testing includes screening for HIV, unless you choose not to have this test. HOME CARE INSTRUCTIONS  Medicines  Follow your health care provider's instructions regarding medicine use. Specific medicines may be either safe or unsafe to take during pregnancy.  Take your prenatal vitamins as directed.  If you develop constipation, try taking a stool softener if your health care provider approves. Diet  Eat regular, well-balanced meals. Choose a variety of foods, such as meat or vegetable-based protein, fish, milk and low-fat dairy products, vegetables, fruits, and whole grain breads and cereals. Your health care provider will help you determine the amount of weight gain that is right for you.  Avoid raw meat and uncooked cheese. These carry germs that can cause birth defects in the baby.  Eating four or five small meals rather than three large meals a day may help relieve nausea and vomiting. If you start to feel nauseous, eating a few soda crackers can be helpful. Drinking liquids between meals instead of during meals also seems to help nausea and vomiting.  If you develop constipation, eat more high-fiber foods, such as fresh vegetables or fruit and whole grains. Drink enough fluids to keep your urine clear or pale yellow. Activity and Exercise  Exercise only as directed by your health care provider. Exercising will help you:  Control your weight.  Stay in shape.  Be prepared for labor and delivery.  Experiencing pain or cramping in the lower abdomen or low back is a good sign that you should stop exercising. Check with your health care provider before continuing normal exercises.  Try to avoid standing for long  periods of time. Move your legs often if you must stand in one place for a long time.  Avoid heavy lifting.  Wear low-heeled shoes, and practice good posture.  You may continue to have sex unless your health care provider directs you otherwise. Relief of Pain or Discomfort  Wear a good support bra for breast tenderness.   Take warm sitz baths to soothe any pain or discomfort caused by hemorrhoids. Use hemorrhoid cream if your health care provider approves.   Rest with your legs elevated if you have leg cramps or low back pain.  If you develop varicose veins in your legs, wear support hose. Elevate your feet for 15 minutes, 3-4 times a day. Limit salt in your diet. Prenatal Care  Schedule your prenatal visits by the twelfth week of pregnancy. They are usually scheduled monthly at first, then more often in the last 2 months before delivery.  Write down your questions. Take them to your prenatal visits.  Keep all your prenatal visits as directed by your   health care provider. Safety  Wear your seat belt at all times when driving.  Make a list of emergency phone numbers, including numbers for family, friends, the hospital, and police and fire departments. General Tips  Ask your health care provider for a referral to a local prenatal education class. Begin classes no later than at the beginning of month 6 of your pregnancy.  Ask for help if you have counseling or nutritional needs during pregnancy. Your health care provider can offer advice or refer you to specialists for help with various needs.  Do not use hot tubs, steam rooms, or saunas.  Do not douche or use tampons or scented sanitary pads.  Do not cross your legs for long periods of time.  Avoid cat litter boxes and soil used by cats. These carry germs that can cause birth defects in the baby and possibly loss of the fetus by miscarriage or stillbirth.  Avoid all smoking, herbs, alcohol, and medicines not prescribed by  your health care provider. Chemicals in these affect the formation and growth of the baby.  Do not use any tobacco products, including cigarettes, chewing tobacco, and electronic cigarettes. If you need help quitting, ask your health care provider. You may receive counseling support and other resources to help you quit.  Schedule a dentist appointment. At home, brush your teeth with a soft toothbrush and be gentle when you floss. SEEK MEDICAL CARE IF:   You have dizziness.  You have mild pelvic cramps, pelvic pressure, or nagging pain in the abdominal area.  You have persistent nausea, vomiting, or diarrhea.  You have a bad smelling vaginal discharge.  You have pain with urination.  You notice increased swelling in your face, hands, legs, or ankles. SEEK IMMEDIATE MEDICAL CARE IF:   You have a fever.  You are leaking fluid from your vagina.  You have spotting or bleeding from your vagina.  You have severe abdominal cramping or pain.  You have rapid weight gain or loss.  You vomit blood or material that looks like coffee grounds.  You are exposed to German measles and have never had them.  You are exposed to fifth disease or chickenpox.  You develop a severe headache.  You have shortness of breath.  You have any kind of trauma, such as from a fall or a car accident.   This information is not intended to replace advice given to you by your health care provider. Make sure you discuss any questions you have with your health care provider.   Document Released: 05/26/2001 Document Revised: 06/22/2014 Document Reviewed: 04/11/2013 Elsevier Interactive Patient Education 2016 [employer]  

## 2016-03-17 NOTE — Progress Notes (Signed)
Subjective:  Candace Liu is a 25 y.o. G3P2002 at 4861w5d being seen today for initial prenatal care.  She is currently monitored for the following issues for this low-risk pregnancy and has Supervision of normal pregnancy on her problem list.  Patient reports no complaints.  Contractions: Not present. Vag. Bleeding: None.   . Denies leaking of fluid.   The following portions of the patient's history were reviewed and updated as appropriate: allergies, current medications, past family history, past medical history, past social history, past surgical history and problem list. Problem list updated.  Objective:   Vitals:   03/17/16 1054  BP: 136/81  Pulse: 99  Temp: 98.4 F (36.9 C)  Weight: 239 lb 3.2 oz (108.5 kg)    Fetal Status:           General:  Alert, oriented and cooperative. Patient is in no acute distress.  Skin: Skin is warm and dry. No rash noted.   Cardiovascular: Normal heart rate noted  Respiratory: Normal respiratory effort, no problems with respiration noted  Abdomen: Soft, gravid, appropriate for gestational age. Pain/Pressure: Absent     Pelvic:  Cervical exam performed        Extremities: Normal range of motion.  Edema: None  Mental Status: Normal mood and affect. Normal behavior. Normal judgment and thought content.   Urinalysis:      Assessment and Plan:  Pregnancy: G3P2002 at 1761w5d  1. Encounter for supervision of normal first pregnancy in first trimester Flu vaccine - HIV antibody - Hemoglobinopathy evaluation - Varicella zoster antibody, IgG - Prenatal Profile I - Culture, OB Urine - ToxASSURE Select 13 (MW), Urine - Cytology - PAP  Preterm labor symptoms and general obstetric precautions including but not limited to vaginal bleeding, contractions, leaking of fluid and fetal movement were reviewed in detail with the patient. Please refer to After Visit Summary for other counseling recommendations.  No Follow-up on file.   Hermina StaggersMichael L Cashton Hosley, MD

## 2016-03-19 LAB — CYTOLOGY - PAP

## 2016-03-22 LAB — URINE CULTURE, OB REFLEX

## 2016-03-22 LAB — CULTURE, OB URINE

## 2016-03-24 LAB — PRENATAL PROFILE I(LABCORP)
ANTIBODY SCREEN: NEGATIVE
Basophils Absolute: 0 10*3/uL (ref 0.0–0.2)
Basos: 0 %
EOS (ABSOLUTE): 0 10*3/uL (ref 0.0–0.4)
Eos: 0 %
HEMOGLOBIN: 12.5 g/dL (ref 11.1–15.9)
Hematocrit: 38.4 % (ref 34.0–46.6)
Hepatitis B Surface Ag: NEGATIVE
IMMATURE GRANS (ABS): 0 10*3/uL (ref 0.0–0.1)
Immature Granulocytes: 0 %
Lymphocytes Absolute: 1.8 10*3/uL (ref 0.7–3.1)
Lymphs: 16 %
MCH: 29.1 pg (ref 26.6–33.0)
MCHC: 32.6 g/dL (ref 31.5–35.7)
MCV: 90 fL (ref 79–97)
MONOCYTES: 6 %
Monocytes Absolute: 0.7 10*3/uL (ref 0.1–0.9)
Neutrophils Absolute: 8.5 10*3/uL — ABNORMAL HIGH (ref 1.4–7.0)
Neutrophils: 78 %
PLATELETS: 313 10*3/uL (ref 150–379)
RBC: 4.29 x10E6/uL (ref 3.77–5.28)
RDW: 15.6 % — AB (ref 12.3–15.4)
RH TYPE: POSITIVE
RPR Ser Ql: NONREACTIVE
Rubella Antibodies, IGG: <0.9 index — ABNORMAL LOW (ref >0.99)
WBC: 11.1 10*3/uL — ABNORMAL HIGH (ref 3.4–10.8)

## 2016-03-24 LAB — HEMOGLOBINOPATHY EVALUATION
HEMOGLOBIN A2 QUANTITATION: 2.4 % (ref 0.7–3.1)
HGB A: 97.6 % (ref 94.0–98.0)
HGB C: 0 %
HGB S: 0 %
Hemoglobin F Quantitation: 0 % (ref 0.0–2.0)

## 2016-03-24 LAB — VARICELLA ZOSTER ANTIBODY, IGG: VARICELLA: 808 {index} (ref >165)

## 2016-03-24 LAB — TOXASSURE SELECT 13 (MW), URINE

## 2016-03-24 LAB — HIV ANTIBODY (ROUTINE TESTING W REFLEX): HIV Screen 4th Generation wRfx: NONREACTIVE

## 2016-03-26 ENCOUNTER — Telehealth: Payer: Self-pay

## 2016-03-26 DIAGNOSIS — B999 Unspecified infectious disease: Secondary | ICD-10-CM

## 2016-03-26 MED ORDER — CEPHALEXIN 500 MG PO CAPS: 500.0000 mg | ORAL_CAPSULE | Freq: Three times a day (TID) | ORAL | 0 refills | Status: AC

## 2016-03-26 NOTE — Telephone Encounter (Signed)
Left vm to call back

## 2016-03-26 NOTE — Telephone Encounter (Signed)
S/w patient and informed of prescription that was sent to pharmacy.

## 2016-04-14 ENCOUNTER — Ambulatory Visit (INDEPENDENT_AMBULATORY_CARE_PROVIDER_SITE_OTHER): Payer: BLUE CROSS/BLUE SHIELD | Admitting: Obstetrics and Gynecology

## 2016-04-14 VITALS — BP 119/87 | HR 120 | Wt 248.0 lb

## 2016-04-14 DIAGNOSIS — Z789 Other specified health status: Secondary | ICD-10-CM

## 2016-04-14 DIAGNOSIS — O2343 Unspecified infection of urinary tract in pregnancy, third trimester: Secondary | ICD-10-CM

## 2016-04-14 DIAGNOSIS — B951 Streptococcus, group B, as the cause of diseases classified elsewhere: Secondary | ICD-10-CM | POA: Insufficient documentation

## 2016-04-14 DIAGNOSIS — O2341 Unspecified infection of urinary tract in pregnancy, first trimester: Secondary | ICD-10-CM

## 2016-04-14 DIAGNOSIS — O2340 Unspecified infection of urinary tract in pregnancy, unspecified trimester: Secondary | ICD-10-CM

## 2016-04-14 DIAGNOSIS — Z3482 Encounter for supervision of other normal pregnancy, second trimester: Secondary | ICD-10-CM

## 2016-04-14 NOTE — Progress Notes (Signed)
Subjective:  Candace Liu is a 25 y.o. G3P2002 at 2177w5d being seen today for ongoing prenatal care.  She is currently monitored for the following issues for this low-risk pregnancy and has Supervision of normal pregnancy; Not immune to rubella; and GBS (group B streptococcus) UTI complicating pregnancy on her problem list.  Patient reports fell on her side yesterday. No bleeding.  Contractions: Not present. Vag. Bleeding: None.   . Denies leaking of fluid.   The following portions of the patient's history were reviewed and updated as appropriate: allergies, current medications, past family history, past medical history, past social history, past surgical history and problem list. Problem list updated.  Objective:   Vitals:   04/14/16 1518  BP: 119/87  Pulse: (!) 120  Weight: 248 lb (112.5 kg)    Fetal Status:           General:  Alert, oriented and cooperative. Patient is in no acute distress.  Skin: Skin is warm and dry. No rash noted.   Cardiovascular: Normal heart rate noted  Respiratory: Normal respiratory effort, no problems with respiration noted  Abdomen: Soft, gravid, appropriate for gestational age. Pain/Pressure: Present     Pelvic:  Cervical exam deferred        Extremities: Normal range of motion.     Mental Status: Normal mood and affect. Normal behavior. Normal judgment and thought content.   Urinalysis:      Assessment and Plan:  Pregnancy: G3P2002 at 6177w5d  1. Not immune to rubella Vaccine PP  2. Group B Streptococcus urinary tract infection affecting pregnancy in first trimester Completed treatment  3. Encounter for supervision of other normal pregnancy in second trimester Quad screen next visit - US MFM OB COMP + 14 WK; Future  Preterm labor symptoms and general obstetric precautions including but not limited to vaginal bleeding, contractions, leaking of fluid and fetal movement were reviewed in detail with the patient. Please refer to After Visit Summary  for other counseling recommendations.  Return in about 4 weeks (around 05/12/2016) for OB visit.   Hermina StaggersMichael L Keysi Oelkers, MD

## 2016-04-24 ENCOUNTER — Encounter (HOSPITAL_COMMUNITY): Payer: Self-pay | Admitting: Obstetrics and Gynecology

## 2016-05-11 ENCOUNTER — Encounter: Payer: Medicaid Other | Admitting: Obstetrics and Gynecology

## 2016-05-11 ENCOUNTER — Ambulatory Visit (HOSPITAL_COMMUNITY): Payer: Medicaid Other

## 2016-05-11 ENCOUNTER — Other Ambulatory Visit: Payer: Self-pay | Admitting: Obstetrics and Gynecology

## 2016-05-11 ENCOUNTER — Ambulatory Visit (INDEPENDENT_AMBULATORY_CARE_PROVIDER_SITE_OTHER): Payer: BLUE CROSS/BLUE SHIELD | Admitting: Obstetrics and Gynecology

## 2016-05-11 ENCOUNTER — Ambulatory Visit (HOSPITAL_COMMUNITY)
Admission: RE | Admit: 2016-05-11 | Discharge: 2016-05-11 | Disposition: A | Discharge status: Home / Self Care | Payer: Medicaid Other | Source: Ambulatory Visit | Attending: Obstetrics and Gynecology | Admitting: Obstetrics and Gynecology

## 2016-05-11 VITALS — BP 97/65 | HR 103 | Temp 97.1°F | Wt 249.2 lb

## 2016-05-11 DIAGNOSIS — O99212 Obesity complicating pregnancy, second trimester: Secondary | ICD-10-CM | POA: Insufficient documentation

## 2016-05-11 DIAGNOSIS — Z3A18 18 weeks gestation of pregnancy: Secondary | ICD-10-CM | POA: Insufficient documentation

## 2016-05-11 DIAGNOSIS — Z363 Encounter for antenatal screening for malformations: Secondary | ICD-10-CM | POA: Insufficient documentation

## 2016-05-11 DIAGNOSIS — B951 Streptococcus, group B, as the cause of diseases classified elsewhere: Secondary | ICD-10-CM

## 2016-05-11 DIAGNOSIS — O2343 Unspecified infection of urinary tract in pregnancy, third trimester: Secondary | ICD-10-CM

## 2016-05-11 DIAGNOSIS — Z3482 Encounter for supervision of other normal pregnancy, second trimester: Secondary | ICD-10-CM

## 2016-05-11 DIAGNOSIS — Z3480 Encounter for supervision of other normal pregnancy, unspecified trimester: Secondary | ICD-10-CM

## 2016-05-11 DIAGNOSIS — O2341 Unspecified infection of urinary tract in pregnancy, first trimester: Secondary | ICD-10-CM

## 2016-05-11 DIAGNOSIS — Z3490 Encounter for supervision of normal pregnancy, unspecified, unspecified trimester: Secondary | ICD-10-CM | POA: Insufficient documentation

## 2016-05-11 NOTE — Progress Notes (Signed)
   PRENATAL VISIT NOTE  Subjective:  Candace Liu is a 25 y.o. G3P2002 at 529w4d being seen today for ongoing prenatal care.  She is currently monitored for the following issues for this low-risk pregnancy and has Supervision of normal pregnancy; Not immune to rubella; GBS (group B streptococcus) UTI complicating pregnancy; and Supervision of normal pregnancy, antepartum on her problem list.  Patient reports no complaints.  Contractions: Irritability. Vag. Bleeding: None.  Movement: Present. Denies leaking of fluid.   The following portions of the patient's history were reviewed and updated as appropriate: allergies, current medications, past family history, past medical history, past social history, past surgical history and problem list. Problem list updated.  Objective:   Vitals:   05/11/16 1142  BP: 97/65  Pulse: (!) 103  Temp: 97.1 F (36.2 C)  Weight: 249 lb 3.2 oz (113 kg)    Fetal Status: Fetal Heart Rate (bpm): 147   Movement: Present     General:  Alert, oriented and cooperative. Patient is in no acute distress.  Skin: Skin is warm and dry. No rash noted.   Cardiovascular: Normal heart rate noted  Respiratory: Normal respiratory effort, no problems with respiration noted  Abdomen: Soft, gravid, appropriate for gestational age. Pain/Pressure: Present     Pelvic:  Cervical exam deferred        Extremities: Normal range of motion.  Edema: None  Mental Status: Normal mood and affect. Normal behavior. Normal judgment and thought content.   Assessment and Plan:  Pregnancy: G3P2002 at 319w4d  1. Supervision of other normal pregnancy, antepartum Patient had ultrasound this morning. Report not yet available for review Patient is doing well without complaints. Has her gender reveal party this weekend  2. Group B Streptococcus urinary tract infection affecting pregnancy in first trimester Will provide prophylaxis when in labor  General obstetric precautions including but not  limited to vaginal bleeding, contractions, leaking of fluid and fetal movement were reviewed in detail with the patient. Please refer to After Visit Summary for other counseling recommendations.  Return in about 4 weeks (around 06/08/2016).   Catalina AntiguaPeggy Nedra Mcinnis, MD

## 2016-05-11 NOTE — Progress Notes (Signed)
Patient states that she feels good today, reports feeling fetal flutter movement.

## 2016-05-19 ENCOUNTER — Inpatient Hospital Stay (HOSPITAL_COMMUNITY)
Admission: AD | Admit: 2016-05-19 | Discharge: 2016-05-19 | Disposition: A | Discharge status: Home / Self Care | Payer: Medicaid Other | Source: Ambulatory Visit | Attending: Obstetrics & Gynecology | Admitting: Obstetrics & Gynecology

## 2016-05-19 ENCOUNTER — Encounter (HOSPITAL_COMMUNITY): Payer: Self-pay

## 2016-05-19 DIAGNOSIS — O99282 Endocrine, nutritional and metabolic diseases complicating pregnancy, second trimester: Secondary | ICD-10-CM | POA: Insufficient documentation

## 2016-05-19 DIAGNOSIS — R42 Dizziness and giddiness: Secondary | ICD-10-CM | POA: Diagnosis present

## 2016-05-19 DIAGNOSIS — O9989 Other specified diseases and conditions complicating pregnancy, childbirth and the puerperium: Secondary | ICD-10-CM | POA: Diagnosis not present

## 2016-05-19 DIAGNOSIS — E860 Dehydration: Secondary | ICD-10-CM | POA: Insufficient documentation

## 2016-05-19 DIAGNOSIS — Z3492 Encounter for supervision of normal pregnancy, unspecified, second trimester: Secondary | ICD-10-CM

## 2016-05-19 DIAGNOSIS — O26892 Other specified pregnancy related conditions, second trimester: Secondary | ICD-10-CM | POA: Insufficient documentation

## 2016-05-19 DIAGNOSIS — G43109 Migraine with aura, not intractable, without status migrainosus: Secondary | ICD-10-CM | POA: Diagnosis not present

## 2016-05-19 DIAGNOSIS — Z3A19 19 weeks gestation of pregnancy: Secondary | ICD-10-CM | POA: Insufficient documentation

## 2016-05-19 LAB — URINALYSIS, ROUTINE W REFLEX MICROSCOPIC
BILIRUBIN URINE: NEGATIVE
Glucose, UA: NEGATIVE mg/dL
HGB URINE DIPSTICK: NEGATIVE
Ketones, ur: 80 mg/dL — AB
NITRITE: NEGATIVE
PROTEIN: NEGATIVE mg/dL
Specific Gravity, Urine: 1.021 (ref 1.005–1.030)
pH: 5 (ref 5.0–8.0)

## 2016-05-19 LAB — COMPREHENSIVE METABOLIC PANEL
ALBUMIN: 3.1 g/dL — AB (ref 3.5–5.0)
ALT: 28 U/L (ref 14–54)
AST: 23 U/L (ref 15–41)
Alkaline Phosphatase: 47 U/L (ref 38–126)
Anion gap: 7 (ref 5–15)
BILIRUBIN TOTAL: 0.1 mg/dL — AB (ref 0.3–1.2)
BUN: 5 mg/dL — AB (ref 6–20)
CHLORIDE: 107 mmol/L (ref 101–111)
CO2: 22 mmol/L (ref 22–32)
CREATININE: 0.45 mg/dL (ref 0.44–1.00)
Calcium: 8.6 mg/dL — ABNORMAL LOW (ref 8.9–10.3)
GFR calc Af Amer: >60 mL/min (ref >60)
GFR calc non Af Amer: >60 mL/min (ref >60)
GLUCOSE: 77 mg/dL (ref 65–99)
POTASSIUM: 3.8 mmol/L (ref 3.5–5.1)
Sodium: 136 mmol/L (ref 135–145)
Total Protein: 6.2 g/dL — ABNORMAL LOW (ref 6.5–8.1)

## 2016-05-19 LAB — CBC
HEMATOCRIT: 34.3 % — AB (ref 36.0–46.0)
Hemoglobin: 11.4 g/dL — ABNORMAL LOW (ref 12.0–15.0)
MCH: 29.2 pg (ref 26.0–34.0)
MCHC: 33.2 g/dL (ref 30.0–36.0)
MCV: 87.7 fL (ref 78.0–100.0)
PLATELETS: 285 10*3/uL (ref 150–400)
RBC: 3.91 MIL/uL (ref 3.87–5.11)
RDW: 14.2 % (ref 11.5–15.5)
WBC: 10.7 10*3/uL — AB (ref 4.0–10.5)

## 2016-05-19 LAB — GLUCOSE, CAPILLARY: GLUCOSE-CAPILLARY: 80 mg/dL (ref 65–99)

## 2016-05-19 MED ORDER — DIPHENHYDRAMINE HCL 25 MG PO CAPS: 25.0000 mg | ORAL_CAPSULE | Freq: Four times a day (QID) | ORAL | 0 refills | Status: DC | PRN

## 2016-05-19 MED ORDER — LACTATED RINGERS IV BOLUS (SEPSIS)
1000.0000 mL | Freq: Once | INTRAVENOUS | Status: AC
Start: 2016-05-19 — End: 2016-05-19
  Administered 2016-05-19: 1000 mL via INTRAVENOUS

## 2016-05-19 MED ORDER — DIPHENHYDRAMINE HCL 50 MG/ML IJ SOLN
25.0000 mg | Freq: Once | INTRAMUSCULAR | Status: AC
  Administered 2016-05-19: 25 mg via INTRAVENOUS
  Filled 2016-05-19: qty 1

## 2016-05-19 MED ORDER — DEXAMETHASONE SODIUM PHOSPHATE 10 MG/ML IJ SOLN
10.0000 mg | Freq: Once | INTRAMUSCULAR | Status: AC
  Administered 2016-05-19: 10 mg via INTRAVENOUS
  Filled 2016-05-19: qty 1

## 2016-05-19 MED ORDER — METOCLOPRAMIDE HCL 10 MG PO TABS: 10.0000 mg | ORAL_TABLET | Freq: Three times a day (TID) | ORAL | 0 refills | Status: DC | PRN

## 2016-05-19 MED ORDER — METOCLOPRAMIDE HCL 5 MG/ML IJ SOLN
10.0000 mg | Freq: Once | INTRAMUSCULAR | Status: AC
  Administered 2016-05-19: 10 mg via INTRAVENOUS
  Filled 2016-05-19: qty 2

## 2016-05-19 NOTE — MAU Note (Signed)
Started seeing spots this morning.  Was dizzy. Sat down for a while, started feeling better, went on to work and it started again.  Then her right arm started tingling.

## 2016-05-19 NOTE — MAU Note (Signed)
Pt has not eaten today. 

## 2016-05-19 NOTE — Discharge Instructions (Signed)
Dehydration, Adult °Dehydration is when there is not enough fluid or water in your body. This happens when you lose more fluids than you take in. Dehydration can range from mild to very bad. It should be treated right away to keep it from getting very bad. °Symptoms of mild dehydration may include: °· Thirst. °· Dry lips. °· Slightly dry mouth. °· Dry, warm skin. °· Dizziness. °Symptoms of moderate dehydration may include: °· Very dry mouth. °· Muscle cramps. °· Dark pee (urine). Pee may be the color of tea. °· Your body making less pee. °· Your eyes making fewer tears. °· Heartbeat that is uneven or faster than normal (palpitations). °· Headache. °· Light-headedness, especially when you stand up from sitting. °· Fainting (syncope). °Symptoms of very bad dehydration may include: °· Changes in skin, such as: °¨ Cold and clammy skin. °¨ Blotchy (mottled) or pale skin. °¨ Skin that does not quickly return to normal after being lightly pinched and let go (poor skin turgor). °· Changes in body fluids, such as: °¨ Feeling very thirsty. °¨ Your eyes making fewer tears. °¨ Not sweating when body temperature is high, such as in hot weather. °¨ Your body making very little pee. °· Changes in vital signs, such as: °¨ Weak pulse. °¨ Pulse that is more than 100 beats a minute when you are sitting still. °¨ Fast breathing. °¨ Low blood pressure. °· Other changes, such as: °¨ Sunken eyes. °¨ Cold hands and feet. °¨ Confusion. °¨ Lack of energy (lethargy). °¨ Trouble waking up from sleep. °¨ Short-term weight loss. °¨ Unconsciousness. °Follow these instructions at home: °· If told by your doctor, drink an ORS: °¨ Make an ORS by using instructions on the package. °¨ Start by drinking small amounts, about ½ cup (120 mL) every 5-10 minutes. °¨ Slowly drink more until you have had the amount that your doctor said to have. °· Drink enough clear fluid to keep your pee clear or pale yellow. If you were told to drink an ORS, finish the ORS  first, then start slowly drinking clear fluids. Drink fluids such as: °¨ Water. Do not drink only water by itself. Doing that can make the salt (sodium) level in your body get too low (hyponatremia). °¨ Ice chips. °¨ Fruit juice that you have added water to (diluted). °¨ Low-calorie sports drinks. °· Avoid: °¨ Alcohol. °¨ Drinks that have a lot of sugar. These include high-calorie sports drinks, fruit juice that does not have water added, and soda. °¨ Caffeine. °¨ Foods that are greasy or have a lot of fat or sugar. °· Take over-the-counter and prescription medicines only as told by your doctor. °· Do not take salt tablets. Doing that can make the salt level in your body get too high (hypernatremia). °· Eat foods that have minerals (electrolytes). Examples include bananas, oranges, potatoes, tomatoes, and spinach. °· Keep all follow-up visits as told by your doctor. This is important. °Contact a doctor if: °· You have belly (abdominal) pain that: °¨ Gets worse. °¨ Stays in one area (localizes). °· You have a rash. °· You have a stiff neck. °· You get angry or annoyed more easily than normal (irritability). °· You are more sleepy than normal. °· You have a harder time waking up than normal. °· You feel: °¨ Weak. °¨ Dizzy. °¨ Very thirsty. °· You have peed (urinated) only a small amount of very dark pee during 6-8 hours. °Get help right away if: °· You have symptoms of   very bad dehydration.  You cannot drink fluids without throwing up (vomiting).  Your symptoms get worse with treatment.  You have a fever.  You have a very bad headache.  You are throwing up or having watery poop (diarrhea) and it:  Gets worse.  Does not go away.  You have blood or something green (bile) in your throw-up.  You have blood in your poop (stool). This may cause poop to look black and tarry.  You have not peed in 6-8 hours.  You pass out (faint).  Your heart rate when you are sitting still is more than 100 beats a  minute.  You have trouble breathing. This information is not intended to replace advice given to you by your health care provider. Make sure you discuss any questions you have with your health care provider. Document Released: 03/28/2009 Document Revised: 12/20/2015 Document Reviewed: 07/26/2015 Elsevier Interactive Patient Education  2017 [employer]   Migraine Headache A migraine headache is a very strong throbbing pain on one side or both sides of your head. Migraines can also cause other symptoms. Talk with your doctor about what things may bring on (trigger) your migraine headaches. Follow these instructions at home: Medicines  Take over-the-counter and prescription medicines only as told by your doctor.  Do not drive or use heavy machinery while taking prescription pain medicine.  To prevent or treat constipation while you are taking prescription pain medicine, your doctor may recommend that you:  Drink enough fluid to keep your pee (urine) clear or pale yellow.  Take over-the-counter or prescription medicines.  Eat foods that are high in fiber. These include fresh fruits and vegetables, whole grains, and beans.  Limit foods that are high in fat and processed sugars. These include fried and sweet foods. Lifestyle  Avoid alcohol.  Do not use any products that contain nicotine or tobacco, such as cigarettes and e-cigarettes. If you need help quitting, ask your doctor.  Get at least 8 hours of sleep every night.  Limit your stress. General instructions  Keep a journal to find out what may bring on your migraines. For example, write down:  What you eat and drink.  How much sleep you get.  Any change in what you eat or drink.  Any change in your medicines.  If you have a migraine:  Avoid things that make your symptoms worse, such as bright lights.  It may help to lie down in a dark, quiet room.  Do not drive or use heavy machinery.  Ask your doctor what  activities are safe for you.  Keep all follow-up visits as told by your doctor. This is important. Contact a doctor if:  You get a migraine that is different or worse than your usual migraines. Get help right away if:  Your migraine gets very bad.  You have a fever.  You have a stiff neck.  You have trouble seeing.  Your muscles feel weak or like you cannot control them.  You start to lose your balance a lot.  You start to have trouble walking.  You pass out (faint). This information is not intended to replace advice given to you by your health care provider. Make sure you discuss any questions you have with your health care provider. Document Released: 03/10/2008 Document Revised: 12/20/2015 Document Reviewed: 11/18/2015 Elsevier Interactive Patient Education  2017 [employer]

## 2016-05-19 NOTE — [facility] Provider Note (Signed)
History     CSN: [id number]  Arrival date and time: 05/19/16 1226   None     Chief Complaint  Patient presents with  . seeing spots  . Dizziness   G3P2002 @19 .5 weeks here with dizziness and seeing spots since 0815 this am. She sat down after onset of sx and had improvement. She then went to work and same sx returned. Associated sx are temporal HA with aura which started at the same time. She reports a hx of migraine HA prior to pregnancy but has only had mild HA to date. She has not used analgesics or OTCs. She reports +FM. No VB, LOF, and ctx. She reports consuming 4 bottles of water since sx started. She did not eat breakfast or lunch today. She reports only eating crackers while at work.     OB History    Gravida Para Term Preterm AB Living   3 2 2  0 0 2   SAB TAB Ectopic Multiple Live Births   0 0 0 0 2      Past Medical History:  Diagnosis Date  . Headache   . No pertinent past medical history   . Obesity   . Pseudotumor cerebri     Past Surgical History:  Procedure Laterality Date  . NO PAST SURGERIES      Family History  Problem Relation Age of Onset  . Heart disease Brother     MI  . Healthy Mother   . Healthy Father   . Migraines Neg Hx     Social History  Substance Use Topics  . Smoking status: Never Smoker  . Smokeless tobacco: Never Used  . Alcohol use Yes     Comment: socially    Allergies: No Known Allergies  Prescriptions Prior to Admission  Medication Sig Dispense Refill Last Dose  . Prenatal Vit-Fe Fumarate-FA (PRENATAL MULTIVITAMIN) TABS tablet Take 1 tablet by mouth daily at 12 noon.   Taking    Review of Systems  Constitutional: Negative.   Eyes: Positive for blurred vision and photophobia.  Gastrointestinal: Negative.   Neurological: Positive for dizziness and headaches.   Results for orders placed or performed during the hospital encounter of 05/19/16 (from the past 24 hour(s))  Glucose, capillary     Status: None   Collection Time: 05/19/16 12:52 PM  Result Value Ref Range   Glucose-Capillary 80 65 - 99 mg/dL  Urinalysis, Routine w reflex microscopic     Status: Abnormal   Collection Time: 05/19/16  1:01 PM  Result Value Ref Range   Color, Urine YELLOW YELLOW   APPearance HAZY (A) CLEAR   Specific Gravity, Urine 1.021 1.005 - 1.030   pH 5.0 5.0 - 8.0   Glucose, UA NEGATIVE NEGATIVE mg/dL   Hgb urine dipstick NEGATIVE NEGATIVE   Bilirubin Urine NEGATIVE NEGATIVE   Ketones, ur 80 (A) NEGATIVE mg/dL   Protein, ur NEGATIVE NEGATIVE mg/dL   Nitrite NEGATIVE NEGATIVE   Leukocytes, UA TRACE (A) NEGATIVE   RBC / HPF 0-5 0 - 5 RBC/hpf   WBC, UA 0-5 0 - 5 WBC/hpf   Bacteria, UA RARE (A) NONE SEEN   Squamous Epithelial / LPF 6-30 (A) NONE SEEN   Mucous PRESENT   CBC     Status: Abnormal   Collection Time: 05/19/16  1:37 PM  Result Value Ref Range   WBC 10.7 (H) 4.0 - 10.5 K/uL   RBC 3.91 3.87 - 5.11 MIL/uL   Hemoglobin 11.4 (L) 12.0 -  15.0 g/dL   HCT 16.134.3 (L) 09.636.0 - 04.546.0 %   MCV 87.7 78.0 - 100.0 fL   MCH 29.2 26.0 - 34.0 pg   MCHC 33.2 30.0 - 36.0 g/dL   RDW 40.914.2 81.111.5 - 91.415.5 %   Platelets 285 150 - 400 K/uL  Comprehensive metabolic panel     Status: Abnormal   Collection Time: 05/19/16  1:37 PM  Result Value Ref Range   Sodium 136 135 - 145 mmol/L   Potassium 3.8 3.5 - 5.1 mmol/L   Chloride 107 101 - 111 mmol/L   CO2 22 22 - 32 mmol/L   Glucose, Bld 77 65 - 99 mg/dL   BUN 5 (L) 6 - 20 mg/dL   Creatinine, Ser 7.820.45 0.44 - 1.00 mg/dL   Calcium 8.6 (L) 8.9 - 10.3 mg/dL   Total Protein 6.2 (L) 6.5 - 8.1 g/dL   Albumin 3.1 (L) 3.5 - 5.0 g/dL   AST 23 15 - 41 U/L   ALT 28 14 - 54 U/L   Alkaline Phosphatase 47 38 - 126 U/L   Total Bilirubin 0.1 (L) 0.3 - 1.2 mg/dL   GFR calc non Af Amer >60 >60 mL/min   GFR calc Af Amer >60 >60 mL/min   Anion gap 7 5 - 15    Physical Exam   Blood pressure 118/73, pulse 102, temperature 99.1 F (37.3 C), temperature source Oral, resp. rate 18, weight  113.1 kg (249 lb 4 oz), last menstrual period 01/02/2016, SpO2 100 %.  Physical Exam  Constitutional: She is oriented to person, place, and time. She appears well-developed and well-nourished. No distress (appears comfortable).  HENT:  Head: Normocephalic and atraumatic.  Eyes: Pupils are equal, round, and reactive to light.  Neck: Normal range of motion. Neck supple.  Cardiovascular: Normal rate.   Respiratory: Effort normal.  Musculoskeletal: Normal range of motion.  Neurological: She is oriented to person, place, and time. No cranial nerve deficit.  Skin: Skin is warm and dry.  Psychiatric: She has a normal mood and affect.  FHT: 150 bpm  MAU Course  Procedures LR 1 L bolus Reglan 10 mg IV Phenergan 25 mg IV Decadron 10 mg IV  MDM Labs ordered and reviewed. HA and other sx improved after meds. Pt tolerating po food and fluids. Stable for discharge home.  Assessment and Plan   1. Second trimester pregnancy   2. Dehydration   3. Migraine with aura and without status migrainosus, not intractable    Discharge home Follow up in [facility] as scheduled Return for worsening sx Tylenol 1g po prn HA    Medication List    TAKE these medications   diphenhydrAMINE 25 mg capsule Commonly known as:  BENADRYL Take 1 capsule (25 mg total) by mouth every 6 (six) hours as needed.   metoCLOPramide 10 MG tablet Commonly known as:  REGLAN Take 1 tablet (10 mg total) by mouth every 8 (eight) hours as needed for nausea.   prenatal multivitamin Tabs tablet Take 1 tablet by mouth daily at 12 noon.      Donette LarryMelanie Saathvik Every, CNM 05/19/2016, 1:09 PM

## 2016-06-10 ENCOUNTER — Ambulatory Visit (INDEPENDENT_AMBULATORY_CARE_PROVIDER_SITE_OTHER): Payer: BLUE CROSS/BLUE SHIELD | Admitting: Obstetrics and Gynecology

## 2016-06-10 VITALS — BP 130/82 | HR 99 | Wt 253.0 lb

## 2016-06-10 DIAGNOSIS — Z789 Other specified health status: Secondary | ICD-10-CM

## 2016-06-10 DIAGNOSIS — Z3490 Encounter for supervision of normal pregnancy, unspecified, unspecified trimester: Secondary | ICD-10-CM

## 2016-06-10 DIAGNOSIS — B951 Streptococcus, group B, as the cause of diseases classified elsewhere: Secondary | ICD-10-CM

## 2016-06-10 DIAGNOSIS — Z3492 Encounter for supervision of normal pregnancy, unspecified, second trimester: Secondary | ICD-10-CM

## 2016-06-10 DIAGNOSIS — O2342 Unspecified infection of urinary tract in pregnancy, second trimester: Secondary | ICD-10-CM

## 2016-06-10 NOTE — Progress Notes (Signed)
Pt concerned about swelling in hands, ankles, and feet. Requests Quad screen today.

## 2016-06-10 NOTE — Patient Instructions (Signed)
Second Trimester of Pregnancy The second trimester is from week 13 through week 28 (months 4 through 6). The second trimester is often a time when you feel your best. Your body has also adjusted to being pregnant, and you begin to feel better physically. Usually, morning sickness has lessened or quit completely, you may have more energy, and you may have an increase in appetite. The second trimester is also a time when the fetus is growing rapidly. At the end of the sixth month, the fetus is about 9 inches long and weighs about 1 pounds. You will likely begin to feel the baby move (quickening) between 18 and 20 weeks of the pregnancy. Body changes during your second trimester Your body continues to go through many changes during your second trimester. The changes vary from woman to woman.  Your weight will continue to increase. You will notice your lower abdomen bulging out.  You may begin to get stretch marks on your hips, abdomen, and breasts.  You may develop headaches that can be relieved by medicines. The medicines should be approved by your health care provider.  You may urinate more often because the fetus is pressing on your bladder.  You may develop or continue to have heartburn as a result of your pregnancy.  You may develop constipation because certain hormones are causing the muscles that push waste through your intestines to slow down.  You may develop hemorrhoids or swollen, bulging veins (varicose veins).  You may have back pain. This is caused by:  Weight gain.  Pregnancy hormones that are relaxing the joints in your pelvis.  A shift in weight and the muscles that support your balance.  Your breasts will continue to grow and they will continue to become tender.  Your gums may bleed and may be sensitive to brushing and flossing.  Dark spots or blotches (chloasma, mask of pregnancy) may develop on your face. This will likely fade after the baby is born.  A dark line  from your belly button to the pubic area (linea nigra) may appear. This will likely fade after the baby is born.  You may have changes in your hair. These can include thickening of your hair, rapid growth, and changes in texture. Some women also have hair loss during or after pregnancy, or hair that feels dry or thin. Your hair will most likely return to normal after your baby is born. What to expect at prenatal visits During a routine prenatal visit:  You will be weighed to make sure you and the fetus are growing normally.  Your blood pressure will be taken.  Your abdomen will be measured to track your baby's growth.  The fetal heartbeat will be listened to.  Any test results from the previous visit will be discussed. Your health care provider may ask you:  How you are feeling.  If you are feeling the baby move.  If you have had any abnormal symptoms, such as leaking fluid, bleeding, severe headaches, or abdominal cramping.  If you are using any tobacco products, including cigarettes, chewing tobacco, and electronic cigarettes.  If you have any questions. Other tests that may be performed during your second trimester include:  Blood tests that check for:  Low iron levels (anemia).  Gestational diabetes (between 24 and 28 weeks).  Rh antibodies. This is to check for a protein on red blood cells (Rh factor).  Urine tests to check for infections, diabetes, or protein in the urine.  An ultrasound to   confirm the proper growth and development of the baby.  An amniocentesis to check for possible genetic problems.  Fetal screens for spina bifida and Down syndrome.  HIV (human immunodeficiency virus) testing. Routine prenatal testing includes screening for HIV, unless you choose not to have this test. Follow these instructions at home: Eating and drinking  Continue to eat regular, healthy meals.  Avoid raw meat, uncooked cheese, cat litter boxes, and soil used by cats. These  carry germs that can cause birth defects in the baby.  Take your prenatal vitamins.  Take 1500-2000 mg of calcium daily starting at the 20th week of pregnancy until you deliver your baby.  If you develop constipation:  Take over-the-counter or prescription medicines.  Drink enough fluid to keep your urine clear or pale yellow.  Eat foods that are high in fiber, such as fresh fruits and vegetables, whole grains, and beans.  Limit foods that are high in fat and processed sugars, such as fried and sweet foods. Activity  Exercise only as directed by your health care provider. Experiencing uterine cramps is a good sign to stop exercising.  Avoid heavy lifting, wear low heel shoes, and practice good posture.  Wear your seat belt at all times when driving.  Rest with your legs elevated if you have leg cramps or low back pain.  Wear a good support bra for breast tenderness.  Do not use hot tubs, steam rooms, or saunas. Lifestyle  Avoid all smoking, herbs, alcohol, and unprescribed drugs. These chemicals affect the formation and growth of the baby.  Do not use any products that contain nicotine or tobacco, such as cigarettes and e-cigarettes. If you need help quitting, ask your health care provider.  A sexual relationship may be continued unless your health care provider directs you otherwise. General instructions  Follow your health care provider's instructions regarding medicine use. There are medicines that are either safe or unsafe to take during pregnancy.  Take warm sitz baths to soothe any pain or discomfort caused by hemorrhoids. Use hemorrhoid cream if your health care provider approves.  If you develop varicose veins, wear support hose. Elevate your feet for 15 minutes, 3-4 times a day. Limit salt in your diet.  Visit your dentist if you have not gone yet during your pregnancy. Use a soft toothbrush to brush your teeth and be gentle when you floss.  Keep all follow-up  prenatal visits as told by your health care provider. This is important. Contact a health care provider if:  You have dizziness.  You have mild pelvic cramps, pelvic pressure, or nagging pain in the abdominal area.  You have persistent nausea, vomiting, or diarrhea.  You have a bad smelling vaginal discharge.  You have pain with urination. Get help right away if:  You have a fever.  You are leaking fluid from your vagina.  You have spotting or bleeding from your vagina.  You have severe abdominal cramping or pain.  You have rapid weight gain or weight loss.  You have shortness of breath with chest pain.  You notice sudden or extreme swelling of your face, hands, ankles, feet, or legs.  You have not felt your baby move in over an hour.  You have severe headaches that do not go away with medicine.  You have vision changes. Summary  The second trimester is from week 13 through week 28 (months 4 through 6). It is also a time when the fetus is growing rapidly.  Your body goes   through many changes during pregnancy. The changes vary from woman to woman.  Avoid all smoking, herbs, alcohol, and unprescribed drugs. These chemicals affect the formation and growth your baby.  Do not use any tobacco products, such as cigarettes, chewing tobacco, and e-cigarettes. If you need help quitting, ask your health care provider.  Contact your health care provider if you have any questions. Keep all prenatal visits as told by your health care provider. This is important. This information is not intended to replace advice given to you by your health care provider. Make sure you discuss any questions you have with your health care provider. Document Released: 05/26/2001 Document Revised: 11/07/2015 Document Reviewed: 08/02/2012 Elsevier Interactive Patient Education  2017 [employer] Inc[employer]  

## 2016-06-10 NOTE — Progress Notes (Signed)
Subjective:  Candace Liu is a 25 y.o. G3P2002 at 6721w6d being seen today for ongoing prenatal care.  She is currently monitored for the following issues for this low-risk pregnancy and has Supervision of normal pregnancy; Not immune to rubella; GBS (group B streptococcus) UTI complicating pregnancy; and Supervision of normal pregnancy, antepartum on her problem list.  Patient reports no complaints.  Contractions: Irregular. Vag. Bleeding: None.  Movement: Present. Denies leaking of fluid.   The following portions of the patient's history were reviewed and updated as appropriate: allergies, current medications, past family history, past medical history, past social history, past surgical history and problem list. Problem list updated.  Objective:   Vitals:   06/10/16 0832  BP: 130/82  Pulse: 99  Weight: 253 lb (114.8 kg)    Fetal Status: Fetal Heart Rate (bpm): 145   Movement: Present     General:  Alert, oriented and cooperative. Patient is in no acute distress.  Skin: Skin is warm and dry. No rash noted.   Cardiovascular: Normal heart rate noted  Respiratory: Normal respiratory effort, no problems with respiration noted  Abdomen: Soft, gravid, appropriate for gestational age. Pain/Pressure: Present     Pelvic:  Cervical exam deferred        Extremities: Normal range of motion.  Edema: Mild pitting, slight indentation  Mental Status: Normal mood and affect. Normal behavior. Normal judgment and thought content.   Urinalysis:      Assessment and Plan:  Pregnancy: G3P2002 at 5721w6d  1. Encounter for supervision of normal pregnancy, antepartum, unspecified gravidity 28 wk  Labs next visit  2. Not immune to rubella Vaccine PP  3. Group B Streptococcus urinary tract infection affecting pregnancy in second trimester Treat in labor  Preterm labor symptoms and general obstetric precautions including but not limited to vaginal bleeding, contractions, leaking of fluid and fetal movement  were reviewed in detail with the patient. Please refer to After Visit Summary for other counseling recommendations.  Return in about 4 weeks (around 07/08/2016) for OB visit.   Hermina StaggersMichael L Mackenze Grandison, MD

## 2016-06-15 NOTE — L&D Delivery Note (Signed)
Delivery Note 26 y.o. G3P2002 at [redacted]w[redacted]d who presented to [facility] in active labor, exam revealed that she was in second stage of labor.  She was rushed to L&D and had a precipitous delivery.  At 9:53 AM, a viable female was delivered via Vaginal, Spontaneous Delivery (Presentation: ROA, cephalic). Delivered en caul.   APGAR: 9, 9. Spontaneous delivery of placenta; intact, 3VC    Anesthesia: None  Lacerations: None Est. Blood Loss (mL):  200  Mom to postpartum.  Baby to Couplet care / Skin to Skin.  Jaynie Collins, MD 09/30/2016, 10:10 AM

## 2016-07-08 ENCOUNTER — Ambulatory Visit (INDEPENDENT_AMBULATORY_CARE_PROVIDER_SITE_OTHER): Payer: BLUE CROSS/BLUE SHIELD | Admitting: Obstetrics & Gynecology

## 2016-07-08 ENCOUNTER — Other Ambulatory Visit: Payer: Medicaid Other

## 2016-07-08 DIAGNOSIS — Z3490 Encounter for supervision of normal pregnancy, unspecified, unspecified trimester: Secondary | ICD-10-CM

## 2016-07-08 DIAGNOSIS — Z3492 Encounter for supervision of normal pregnancy, unspecified, second trimester: Secondary | ICD-10-CM

## 2016-07-08 NOTE — Progress Notes (Signed)
Patient reports good fetal movement, and complains of a burning feeling in her stomach that comes and goes when she walks.

## 2016-07-08 NOTE — Progress Notes (Signed)
   PRENATAL VISIT NOTE  Subjective:Burning in low abdomen occasionally  Candace Liu is a 26 y.o. G3P2002 at 6745w6d being seen today for ongoing prenatal care.  She is currently monitored for the following issues for this low-risk pregnancy and has Supervision of normal pregnancy; Not immune to rubella; GBS (group B streptococcus) UTI complicating pregnancy; and Supervision of normal pregnancy, antepartum on her problem list.  Patient reports as above.  Contractions: Irritability. Vag. Bleeding: None.  Movement: Present. Denies leaking of fluid.   The following portions of the patient's history were reviewed and updated as appropriate: allergies, current medications, past family history, past medical history, past social history, past surgical history and problem list. Problem list updated.  Objective:   Vitals:   07/08/16 0852  BP: 117/78  Pulse: 94  Temp: 98.4 F (36.9 C)  Weight: 250 lb 14.4 oz (113.8 kg)    Fetal Status: Fetal Heart Rate (bpm): 149   Movement: Present     General:  Alert, oriented and cooperative. Patient is in no acute distress.  Skin: Skin is warm and dry. No rash noted.   Cardiovascular: Normal heart rate noted  Respiratory: Normal respiratory effort, no problems with respiration noted  Abdomen: Soft, gravid, appropriate for gestational age. Pain/Pressure: Present     Pelvic:  Cervical exam deferred        Extremities: Normal range of motion.  Edema: Trace  Mental Status: Normal mood and affect. Normal behavior. Normal judgment and thought content.   Assessment and Plan:  Pregnancy: G3P2002 at 1745w6d  1. Encounter for supervision of normal pregnancy, antepartum, unspecified gravidity Discussed possible future [place] as indicated and routine f/u - Glucose Tolerance, 2 Hours w/1 Hour - RPR - CBC - HIV antibody  Preterm labor symptoms and general obstetric precautions including but not limited to vaginal bleeding, contractions, leaking of fluid and fetal  movement were reviewed in detail with the patient. Please refer to After Visit Summary for other counseling recommendations.  Return in about 2 weeks (around 07/22/2016).   Adam PhenixJames G Arnold, MD

## 2016-07-09 LAB — CBC
HEMOGLOBIN: 11.1 g/dL (ref 11.1–15.9)
Hematocrit: 34.3 % (ref 34.0–46.6)
MCH: 28.7 pg (ref 26.6–33.0)
MCHC: 32.4 g/dL (ref 31.5–35.7)
MCV: 89 fL (ref 79–97)
Platelets: 266 10*3/uL (ref 150–379)
RBC: 3.87 x10E6/uL (ref 3.77–5.28)
RDW: 15.4 % (ref 12.3–15.4)
WBC: 11.2 10*3/uL — ABNORMAL HIGH (ref 3.4–10.8)

## 2016-07-09 LAB — HIV ANTIBODY (ROUTINE TESTING W REFLEX): HIV Screen 4th Generation wRfx: NONREACTIVE

## 2016-07-09 LAB — GLUCOSE TOLERANCE, 2 HOURS W/ 1HR
Glucose, 1 hour: 124 mg/dL (ref 65–179)
Glucose, 2 hour: 106 mg/dL (ref 65–152)
Glucose, Fasting: 74 mg/dL (ref 65–91)

## 2016-07-09 LAB — RPR: RPR Ser Ql: NONREACTIVE

## 2016-07-23 ENCOUNTER — Encounter: Payer: Medicaid Other | Admitting: Certified Nurse Midwife

## 2016-07-27 ENCOUNTER — Ambulatory Visit (INDEPENDENT_AMBULATORY_CARE_PROVIDER_SITE_OTHER): Payer: BLUE CROSS/BLUE SHIELD | Admitting: Certified Nurse Midwife

## 2016-07-27 VITALS — BP 116/71 | HR 93 | Wt 248.0 lb

## 2016-07-27 DIAGNOSIS — Z789 Other specified health status: Secondary | ICD-10-CM

## 2016-07-27 DIAGNOSIS — Z3480 Encounter for supervision of other normal pregnancy, unspecified trimester: Secondary | ICD-10-CM

## 2016-07-27 DIAGNOSIS — B951 Streptococcus, group B, as the cause of diseases classified elsewhere: Secondary | ICD-10-CM

## 2016-07-27 DIAGNOSIS — O2342 Unspecified infection of urinary tract in pregnancy, second trimester: Secondary | ICD-10-CM

## 2016-07-27 NOTE — Progress Notes (Signed)
   PRENATAL VISIT NOTE  Subjective:  Candace Liu is a 26 y.o. G3P2002 at 49w4dbeing seen today for ongoing prenatal care.  She is currently monitored for the following issues for this low-risk pregnancy and has Supervision of normal pregnancy; Not immune to rubella; GBS (group B streptococcus) UTI complicating pregnancy; and Supervision of normal pregnancy, antepartum on her problem list.  Patient reports no complaints.  Contractions: Not present. Vag. Bleeding: None.  Movement: Present. Denies leaking of fluid.   The following portions of the patient's history were reviewed and updated as appropriate: allergies, current medications, past family history, past medical history, past social history, past surgical history and problem list. Problem list updated.  Objective:   Vitals:   07/27/16 0950  BP: 116/71  Pulse: 93  Weight: 248 lb (112.5 kg)    Fetal Status: Fetal Heart Rate (bpm): 155 Fundal Height: 30 cm Movement: Present     General:  Alert, oriented and cooperative. Patient is in no acute distress.  Skin: Skin is warm and dry. No rash noted.   Cardiovascular: Normal heart rate noted  Respiratory: Normal respiratory effort, no problems with respiration noted  Abdomen: Soft, gravid, appropriate for gestational age. Pain/Pressure: Absent     Pelvic:  Cervical exam deferred        Extremities: Normal range of motion.     Mental Status: Normal mood and affect. Normal behavior. Normal judgment and thought content.   Assessment and Plan:  Pregnancy: G3P2002 at 253w4d1. Supervision of other normal pregnancy, antepartum     Doing well.  2. Group B Streptococcus urinary tract infection affecting pregnancy in second trimester     PCN for delivery  3. Not immune to rubella    MMR postpartum  Preterm labor symptoms and general obstetric precautions including but not limited to vaginal bleeding, contractions, leaking of fluid and fetal movement were reviewed in detail with the  patient. Please refer to After Visit Summary for other counseling recommendations.  Return in about 2 weeks (around 08/10/2016) [place]  RaMorene CrockerCNM

## 2016-08-10 ENCOUNTER — Ambulatory Visit (INDEPENDENT_AMBULATORY_CARE_PROVIDER_SITE_OTHER): Payer: BLUE CROSS/BLUE SHIELD | Admitting: Certified Nurse Midwife

## 2016-08-10 VITALS — BP 122/78 | HR 95 | Wt 249.0 lb

## 2016-08-10 DIAGNOSIS — O2343 Unspecified infection of urinary tract in pregnancy, third trimester: Secondary | ICD-10-CM

## 2016-08-10 DIAGNOSIS — Z3480 Encounter for supervision of other normal pregnancy, unspecified trimester: Secondary | ICD-10-CM

## 2016-08-10 DIAGNOSIS — Z23 Encounter for immunization: Secondary | ICD-10-CM | POA: Diagnosis not present

## 2016-08-10 DIAGNOSIS — B951 Streptococcus, group B, as the cause of diseases classified elsewhere: Secondary | ICD-10-CM

## 2016-08-10 NOTE — Progress Notes (Signed)
Pt states some lower pelvic pressure and cramping.

## 2016-08-10 NOTE — Progress Notes (Signed)
   PRENATAL VISIT NOTE  Subjective:  Candace Liu is a 26 y.o. G3P2002 at 7862w4d being seen today for ongoing prenatal care.  She is currently monitored for the following issues for this low-risk pregnancy and has Supervision of normal pregnancy; Not immune to rubella; GBS (group B streptococcus) UTI complicating pregnancy; and Supervision of normal pregnancy, antepartum on her problem list.  Patient reports no complaints.  Contractions: Irritability. Vag. Bleeding: None.  Movement: Present. Denies leaking of fluid.   The following portions of the patient's history were reviewed and updated as appropriate: allergies, current medications, past family history, past medical history, past social history, past surgical history and problem list. Problem list updated.  Objective:   Vitals:   08/10/16 0854  BP: 122/78  Pulse: 95  Weight: 249 lb (112.9 kg)    Fetal Status: Fetal Heart Rate (bpm): 148 Fundal Height: 31 cm Movement: Present     General:  Alert, oriented and cooperative. Patient is in no acute distress.  Skin: Skin is warm and dry. No rash noted.   Cardiovascular: Normal heart rate noted  Respiratory: Normal respiratory effort, no problems with respiration noted  Abdomen: Soft, gravid, appropriate for gestational age. Pain/Pressure: Present     Pelvic:  Cervical exam deferred        Extremities: Normal range of motion.     Mental Status: Normal mood and affect. Normal behavior. Normal judgment and thought content.   Assessment and Plan:  Pregnancy: G3P2002 at 3462w4d  1. Supervision of other normal pregnancy, antepartum     Doing well. - Tdap vaccine greater than or equal to 7yo IM  2. Group B Streptococcus urinary tract infection affecting pregnancy in third trimester     PCN for delivery  Preterm labor symptoms and general obstetric precautions including but not limited to vaginal bleeding, contractions, leaking of fluid and fetal movement were reviewed in detail with the  patient. Please refer to After Visit Summary for other counseling recommendations.  Return in about 2 weeks (around 08/24/2016) for ROB.   Roe Coombsachelle A [place], CNM

## 2016-08-13 ENCOUNTER — Encounter (HOSPITAL_COMMUNITY): Payer: Self-pay | Admitting: *Deleted

## 2016-08-13 ENCOUNTER — Inpatient Hospital Stay (HOSPITAL_COMMUNITY)
Admission: AD | Admit: 2016-08-13 | Discharge: 2016-08-13 | Disposition: A | Discharge status: Home / Self Care | Payer: BLUE CROSS/BLUE SHIELD | Source: Ambulatory Visit | Attending: Obstetrics & Gynecology | Admitting: Obstetrics & Gynecology

## 2016-08-13 DIAGNOSIS — O26893 Other specified pregnancy related conditions, third trimester: Secondary | ICD-10-CM | POA: Insufficient documentation

## 2016-08-13 DIAGNOSIS — R102 Pelvic and perineal pain: Secondary | ICD-10-CM | POA: Insufficient documentation

## 2016-08-13 DIAGNOSIS — O9989 Other specified diseases and conditions complicating pregnancy, childbirth and the puerperium: Secondary | ICD-10-CM | POA: Diagnosis not present

## 2016-08-13 DIAGNOSIS — M25551 Pain in right hip: Secondary | ICD-10-CM | POA: Diagnosis present

## 2016-08-13 DIAGNOSIS — Z3A32 32 weeks gestation of pregnancy: Secondary | ICD-10-CM | POA: Diagnosis not present

## 2016-08-13 DIAGNOSIS — N949 Unspecified condition associated with female genital organs and menstrual cycle: Secondary | ICD-10-CM | POA: Diagnosis not present

## 2016-08-13 LAB — URINALYSIS, ROUTINE W REFLEX MICROSCOPIC
BACTERIA UA: NONE SEEN
Bilirubin Urine: NEGATIVE
GLUCOSE, UA: NEGATIVE mg/dL
HGB URINE DIPSTICK: NEGATIVE
Ketones, ur: 20 mg/dL — AB
NITRITE: NEGATIVE
PH: 5 (ref 5.0–8.0)
Protein, ur: NEGATIVE mg/dL
RBC / HPF: NONE SEEN RBC/hpf (ref 0–5)
SPECIFIC GRAVITY, URINE: 1.028 (ref 1.005–1.030)

## 2016-08-13 LAB — CBC
HEMATOCRIT: 32.6 % — AB (ref 36.0–46.0)
HEMOGLOBIN: 11.1 g/dL — AB (ref 12.0–15.0)
MCH: 29.1 pg (ref 26.0–34.0)
MCHC: 34 g/dL (ref 30.0–36.0)
MCV: 85.3 fL (ref 78.0–100.0)
Platelets: 270 10*3/uL (ref 150–400)
RBC: 3.82 MIL/uL — ABNORMAL LOW (ref 3.87–5.11)
RDW: 15 % (ref 11.5–15.5)
WBC: 12.5 10*3/uL — ABNORMAL HIGH (ref 4.0–10.5)

## 2016-08-13 MED ORDER — CYCLOBENZAPRINE HCL 10 MG PO TABS: 10.0000 mg | ORAL_TABLET | Freq: Three times a day (TID) | ORAL | 1 refills | Status: DC | PRN

## 2016-08-13 NOTE — MAU Provider Note (Signed)
Patient Candace Liu is a 26 year old  G3P2002 at 32 weeks and 0 days here with complaints of right-sided hip pain that radiates to her right thigh and up under her rib cage.  History     CSN: [id number]  Arrival date and time: 08/13/16 1759   None     Chief Complaint  Patient presents with  . RIGHT SIDED PAIN   Abdominal Pain  This is a new problem. The current episode started in the past 7 days. The onset quality is gradual. The pain is located in the RLQ (it starts in her hip and radiates up and then down her right leg). The pain is at a severity of 8/10. The quality of the pain is sharp. Pain radiation: radiates down her right leg. Pertinent negatives include no diarrhea, dysuria, fever, nausea or vomiting. Associated symptoms comments: Had diarrhea yesterday (8 times) but no diarrhea or solid bowel movements today. Exacerbated by: walking. Relieved by: sitting down makes it better. She has tried nothing for the symptoms.  She says that it "kills her " to walk and she could hardly walk at work today. She does not feel the pain now bc she is at rest.   Patient desires an ultrasound to see how big the baby is. I explained that there was no medical reason to have an [place] (not diabetic, no Bp problems) and that if she measures S > D at her prenatal visit then she and her provider can talk about an [place].   OB History    Gravida Para Term Preterm AB Living   3 2 2  0 0 2   SAB TAB Ectopic Multiple Live Births   0 0 0 0 2      Past Medical History:  Diagnosis Date  . Headache   . No pertinent past medical history   . Obesity   . Pseudotumor cerebri     Past Surgical History:  Procedure Laterality Date  . NO PAST SURGERIES      Family History  Problem Relation Age of Onset  . Heart disease Brother     MI  . Healthy Mother   . Healthy Father   . Migraines Neg Hx     Social History  Substance Use Topics  . Smoking status: Never Smoker  . Smokeless tobacco: Never Used  .  Alcohol use Yes     Comment: socially    Allergies: No Known Allergies  Prescriptions Prior to Admission  Medication Sig Dispense Refill Last Dose  . Prenatal Vit-Fe Fumarate-FA (PRENATAL MULTIVITAMIN) TABS tablet Take 1 tablet by mouth daily at 12 noon.   Taking    Review of Systems  Constitutional: Negative for fever.  Gastrointestinal: Positive for abdominal pain. Negative for diarrhea, nausea and vomiting.  Genitourinary: Negative for dysuria.  Allergic/Immunologic: Negative.   Neurological: Negative.   Hematological: Negative.   Psychiatric/Behavioral: Negative.    Physical Exam   Blood pressure 132/74, pulse 98, temperature 98.4 F (36.9 C), temperature source Oral, resp. rate 20, height 5\' 5"  (1.651 m), weight 252 lb 8 oz (114.5 kg), last menstrual period 01/02/2016.  Physical Exam  Constitutional: She is oriented to person, place, and time. She appears well-developed.  Eyes: Pupils are equal, round, and reactive to light.  Neck: Normal range of motion.  Respiratory: Effort normal.  GI: Soft. She exhibits no distension and no mass. There is no tenderness. There is no rebound and no guarding.  Genitourinary:  Genitourinary Comments: Closed,  long and thick  Musculoskeletal: Normal range of motion.  Neurological: She is alert and oriented to person, place, and time.  Skin: Skin is warm and dry.  Psychiatric: She has a normal mood and affect.    MAU Course  Procedures  MDM -NST: 150 with accelerations, no deceleartions, moderate variability and occasional contractions.  -CBC: WNL Vitals:   08/13/16 1812  BP: 132/74  Pulse: 98  Resp: 20  Temp: 98.4 F (36.9 C)    Assessment and Plan   1. Round ligament pain    2. Patient to be discharged with recommendations to purchase an abdominal binder. Reassured patient of the normalcy of musculo-skeletal pain in pregnancy. Patient will keep her next prenatal visit.  3. Reviewed when to return to the MAU (bleeding,  leaking of fluid, decreased fetal movements).   Charlesetta GaribaldiKathryn Lorraine Aalyssa Elderkin CNM 08/13/2016, 6:49 PM

## 2016-08-13 NOTE — MAU Note (Addendum)
PT  SAYS  HER RIGHT  SIDE  HURTS-  FROM HIP  TO THIGH -   STARTED ON Tuesday -    NO MED  FOR  PAIN-    CALLED  DR  TODAY -   TOLD  TO COME HERE.     WENT  TO DR ON Monday-  TOLD  THEM   SHE HAVING  [facility]-   SAME  NOW  AS Monday.       LAST SEX-   SAT

## 2016-08-13 NOTE — Discharge Instructions (Signed)
Round Ligament Pain The round ligament is a cord of muscle and tissue that helps to support the uterus. It can become a source of pain during pregnancy if it becomes stretched or twisted as the baby grows. The pain usually begins in the second trimester of pregnancy, and it can come and go until the baby is delivered. It is not a serious problem, and it does not cause harm to the baby. Round ligament pain is usually a short, sharp, and pinching pain, but it can also be a dull, lingering, and aching pain. The pain is felt in the lower side of the abdomen or in the groin. It usually starts deep in the groin and moves up to the outside of the hip area. Pain can occur with:  A sudden change in position.  Rolling over in bed.  Coughing or sneezing.  Physical activity. Follow these instructions at home: Watch your condition for any changes. Take these steps to help with your pain:  When the pain starts, relax. Then try:  Sitting down.  Flexing your knees up to your abdomen.  Lying on your side with one pillow under your abdomen and another pillow between your legs.  Sitting in a warm bath for 15-20 minutes or until the pain goes away.  Take over-the-counter and prescription medicines only as told by your health care provider.  Move slowly when you sit and stand.  Avoid long walks if they cause pain.  Stop or lessen your physical activities if they cause pain. Contact a health care provider if:  Your pain does not go away with treatment.  You feel pain in your back that you did not have before.  Your medicine is not helping. Get help right away if:  You develop a fever or chills.  You develop uterine contractions.  You develop vaginal bleeding.  You develop nausea or vomiting.  You develop diarrhea.  You have pain when you urinate. This information is not intended to replace advice given to you by your health care provider. Make sure you discuss any questions you have with  your health care provider. Document Released: 03/10/2008 Document Revised: 11/07/2015 Document Reviewed: 08/08/2014 Elsevier Interactive Patient Education  2017 [employer]  

## 2016-08-24 ENCOUNTER — Encounter: Payer: BLUE CROSS/BLUE SHIELD | Admitting: Certified Nurse Midwife

## 2016-08-27 ENCOUNTER — Ambulatory Visit (INDEPENDENT_AMBULATORY_CARE_PROVIDER_SITE_OTHER): Payer: BLUE CROSS/BLUE SHIELD | Admitting: Certified Nurse Midwife

## 2016-08-27 ENCOUNTER — Encounter: Payer: Self-pay | Admitting: Certified Nurse Midwife

## 2016-08-27 VITALS — BP 132/84 | HR 98 | Wt 256.0 lb

## 2016-08-27 DIAGNOSIS — Z3483 Encounter for supervision of other normal pregnancy, third trimester: Secondary | ICD-10-CM

## 2016-08-27 DIAGNOSIS — R519 Headache, unspecified: Secondary | ICD-10-CM | POA: Insufficient documentation

## 2016-08-27 DIAGNOSIS — O2343 Unspecified infection of urinary tract in pregnancy, third trimester: Secondary | ICD-10-CM

## 2016-08-27 DIAGNOSIS — R51 Headache: Secondary | ICD-10-CM

## 2016-08-27 DIAGNOSIS — B951 Streptococcus, group B, as the cause of diseases classified elsewhere: Secondary | ICD-10-CM

## 2016-08-27 DIAGNOSIS — Z789 Other specified health status: Secondary | ICD-10-CM

## 2016-08-27 DIAGNOSIS — Z3480 Encounter for supervision of other normal pregnancy, unspecified trimester: Secondary | ICD-10-CM

## 2016-08-27 NOTE — Progress Notes (Signed)
Patient reports good fetal movement and contractions that come and go. 

## 2016-08-27 NOTE — Progress Notes (Signed)
   PRENATAL VISIT NOTE  Subjective:  Candace Liu is a 26 y.o. G3P2002 at 39w0dbeing seen today for ongoing prenatal care.  She is currently monitored for the following issues for this low-risk pregnancy and has Not immune to rubella; GBS (group B streptococcus) UTI complicating pregnancy; and Supervision of normal pregnancy, antepartum on her problem list.  Patient reports no complaints.  Contractions: Irregular. Vag. Bleeding: None.  Movement: Present. Denies leaking of fluid.   The following portions of the patient's history were reviewed and updated as appropriate: allergies, current medications, past family history, past medical history, past social history, past surgical history and problem list. Problem list updated.  Objective:   Vitals:   08/27/16 1322  BP: 132/84  Pulse: 98  Weight: 256 lb (116.1 kg)    Fetal Status: Fetal Heart Rate (bpm): 135 Fundal Height: 34 cm Movement: Present     General:  Alert, oriented and cooperative. Patient is in no acute distress.  Skin: Skin is warm and dry. No rash noted.   Cardiovascular: Normal heart rate noted  Respiratory: Normal respiratory effort, no problems with respiration noted  Abdomen: Soft, gravid, appropriate for gestational age. Pain/Pressure: Present     Pelvic:  Cervical exam deferred        Extremities: Normal range of motion.  Edema: Trace  Mental Status: Normal mood and affect. Normal behavior. Normal judgment and thought content.   Assessment and Plan:  Pregnancy: G3P2002 at 337w0d1. Supervision of other normal pregnancy, antepartum     Doing well  2. Not immune to rubella     MMR postpartum  3. Group B Streptococcus urinary tract infection affecting pregnancy in third trimester    PCN for delivery  Preterm labor symptoms and general obstetric precautions including but not limited to vaginal bleeding, contractions, leaking of fluid and fetal movement were reviewed in detail with the patient. Please refer to  After Visit Summary for other counseling recommendations.  Return in about 1 week (around 09/03/2016) for ROB.   RaMorene CrockerCNM

## 2016-09-03 ENCOUNTER — Encounter: Payer: BLUE CROSS/BLUE SHIELD | Admitting: Obstetrics

## 2016-09-10 ENCOUNTER — Other Ambulatory Visit (HOSPITAL_COMMUNITY)
Admission: RE | Admit: 2016-09-10 | Discharge: 2016-09-10 | Disposition: A | Discharge status: Home / Self Care | Payer: BLUE CROSS/BLUE SHIELD | Source: Ambulatory Visit | Attending: Obstetrics & Gynecology | Admitting: Obstetrics & Gynecology

## 2016-09-10 ENCOUNTER — Ambulatory Visit (INDEPENDENT_AMBULATORY_CARE_PROVIDER_SITE_OTHER): Payer: BLUE CROSS/BLUE SHIELD | Admitting: Obstetrics & Gynecology

## 2016-09-10 VITALS — BP 126/85 | HR 108 | Wt 258.0 lb

## 2016-09-10 DIAGNOSIS — Z3483 Encounter for supervision of other normal pregnancy, third trimester: Secondary | ICD-10-CM | POA: Diagnosis not present

## 2016-09-10 DIAGNOSIS — Z3480 Encounter for supervision of other normal pregnancy, unspecified trimester: Secondary | ICD-10-CM

## 2016-09-10 NOTE — Progress Notes (Signed)
   PRENATAL VISIT NOTE  Subjective:  Candace Liu is a 26 y.o. G3P2002 at 7026w0d being seen today for ongoing prenatal care.  She is currently monitored for the following issues for this low-risk pregnancy and has Not immune to rubella; GBS (group B streptococcus) UTI complicating pregnancy; and Supervision of normal pregnancy, antepartum on her problem list.  Patient reports occasional contractions.  Contractions: Irregular. Vag. Bleeding: None.  Movement: Present. Denies leaking of fluid.   The following portions of the patient's history were reviewed and updated as appropriate: allergies, current medications, past family history, past medical history, past social history, past surgical history and problem list. Problem list updated.  Objective:   Vitals:   09/10/16 1117  BP: 126/85  Pulse: (!) 108  Weight: 258 lb (117 kg)    Fetal Status:     Movement: Present     General:  Alert, oriented and cooperative. Patient is in no acute distress.  Skin: Skin is warm and dry. No rash noted.   Cardiovascular: Normal heart rate noted  Respiratory: Normal respiratory effort, no problems with respiration noted  Abdomen: Soft, gravid, appropriate for gestational age. Pain/Pressure: Present     Pelvic:  Cervical exam performed        Extremities: Normal range of motion.  Edema: Trace  Mental Status: Normal mood and affect. Normal behavior. Normal judgment and thought content.   Assessment and Plan:  Pregnancy: G3P2002 at 3226w0d  1. Supervision of other normal pregnancy, antepartum - Urine cytology ancillary only  Preterm labor symptoms and general obstetric precautions including but not limited to vaginal bleeding, contractions, leaking of fluid and fetal movement were reviewed in detail with the patient. Please refer to After Visit Summary for other counseling recommendations.  Return in about 1 week (around 09/17/2016).   Adam PhenixJames G Arnold, MD

## 2016-09-10 NOTE — Patient Instructions (Signed)

## 2016-09-11 LAB — URINE CYTOLOGY ANCILLARY ONLY
Chlamydia: NEGATIVE
Neisseria Gonorrhea: NEGATIVE

## 2016-09-21 ENCOUNTER — Ambulatory Visit (INDEPENDENT_AMBULATORY_CARE_PROVIDER_SITE_OTHER): Payer: BLUE CROSS/BLUE SHIELD | Admitting: Obstetrics and Gynecology

## 2016-09-21 VITALS — BP 135/84 | HR 96 | Wt 257.4 lb

## 2016-09-21 DIAGNOSIS — B951 Streptococcus, group B, as the cause of diseases classified elsewhere: Secondary | ICD-10-CM

## 2016-09-21 DIAGNOSIS — Z789 Other specified health status: Secondary | ICD-10-CM

## 2016-09-21 DIAGNOSIS — O2343 Unspecified infection of urinary tract in pregnancy, third trimester: Secondary | ICD-10-CM

## 2016-09-21 DIAGNOSIS — Z3480 Encounter for supervision of other normal pregnancy, unspecified trimester: Secondary | ICD-10-CM

## 2016-09-21 NOTE — Progress Notes (Signed)
Patient states that she has been leaking fluid since yesterday, reports good fetal movement and irregular contractions.

## 2016-09-21 NOTE — Progress Notes (Signed)
   PRENATAL VISIT NOTE  Subjective:  Candace Liu is a 26 y.o. G3P2002 at [redacted]w[redacted]d being seen today for ongoing prenatal care.  She is currently monitored for the following issues for this low-risk pregnancy and has Not immune to rubella; GBS (group B streptococcus) UTI complicating pregnancy; and Supervision of normal pregnancy, antepartum on her problem list.  Patient reports leakage of fluid yesterday and some this morning.  Contractions: Irregular. Vag. Bleeding: None.  Movement: Present. Denies leaking of fluid.   The following portions of the patient's history were reviewed and updated as appropriate: allergies, current medications, past family history, past medical history, past social history, past surgical history and problem list. Problem list updated.  Objective:   Vitals:   09/21/16 1025  BP: 135/84  Pulse: 96  Weight: 257 lb 6.4 oz (116.8 kg)    Fetal Status: Fetal Heart Rate (bpm): 137 Fundal Height: 37 cm Movement: Present  Presentation: Vertex  General:  Alert, oriented and cooperative. Patient is in no acute distress.  Skin: Skin is warm and dry. No rash noted.   Cardiovascular: Normal heart rate noted  Respiratory: Normal respiratory effort, no problems with respiration noted  Abdomen: Soft, gravid, appropriate for gestational age. Pain/Pressure: Absent     Pelvic:  Cervical exam performed Dilation: 2.5 Effacement (%): 50 Station: -3  Extremities: Normal range of motion.  Edema: Trace  Mental Status: Normal mood and affect. Normal behavior. Normal judgment and thought content.   Assessment and Plan:  Pregnancy: G3P2002 at [redacted]w[redacted]d  1. Group B Streptococcus urinary tract infection affecting pregnancy in third trimester Will provide prophylaxis in labor  2. Not immune to rubella Will offer pp  3. Supervision of other normal pregnancy, antepartum Neg pooling on speculum exam Neg ferning Nitrazine negative Precautions reviewed with the patient  Term labor symptoms  and general obstetric precautions including but not limited to vaginal bleeding, contractions, leaking of fluid and fetal movement were reviewed in detail with the patient. Please refer to After Visit Summary for other counseling recommendations.  Return in about 1 week (around 09/28/2016) for ROB.   Catalina Antigua, MD

## 2016-09-28 ENCOUNTER — Encounter: Payer: Self-pay | Admitting: Certified Nurse Midwife

## 2016-09-28 ENCOUNTER — Ambulatory Visit (INDEPENDENT_AMBULATORY_CARE_PROVIDER_SITE_OTHER): Payer: BLUE CROSS/BLUE SHIELD | Admitting: Certified Nurse Midwife

## 2016-09-28 VITALS — BP 143/89 | HR 96 | Wt 259.0 lb

## 2016-09-28 DIAGNOSIS — O2343 Unspecified infection of urinary tract in pregnancy, third trimester: Secondary | ICD-10-CM

## 2016-09-28 DIAGNOSIS — Z789 Other specified health status: Secondary | ICD-10-CM

## 2016-09-28 DIAGNOSIS — B951 Streptococcus, group B, as the cause of diseases classified elsewhere: Secondary | ICD-10-CM

## 2016-09-28 DIAGNOSIS — Z3483 Encounter for supervision of other normal pregnancy, third trimester: Secondary | ICD-10-CM

## 2016-09-28 DIAGNOSIS — Z3480 Encounter for supervision of other normal pregnancy, unspecified trimester: Secondary | ICD-10-CM

## 2016-09-28 NOTE — Progress Notes (Signed)
Patient wants to be checked 

## 2016-09-28 NOTE — Progress Notes (Signed)
   PRENATAL VISIT NOTE  Subjective:  Candace Liu is a 26 y.o. G3P2002 at 79w4dbeing seen today for ongoing prenatal care.  She is currently monitored for the following issues for this low-risk pregnancy and has Not immune to rubella; GBS (group B streptococcus) UTI complicating pregnancy; and Supervision of normal pregnancy, antepartum on her problem list.  Patient reports backache, no bleeding, no leaking and occasional contractions.  Contractions: Irregular. Vag. Bleeding: None.  Movement: Present. Denies leaking of fluid.   The following portions of the patient's history were reviewed and updated as appropriate: allergies, current medications, past family history, past medical history, past social history, past surgical history and problem list. Problem list updated.  Objective:   Vitals:   09/28/16 1600  BP: (!) 143/89  Pulse: 96  Weight: 259 lb (117.5 kg)    Fetal Status: Fetal Heart Rate (bpm): 142 Fundal Height: 35 cm Movement: Present  Presentation: Vertex  General:  Alert, oriented and cooperative. Patient is in no acute distress.  Skin: Skin is warm and dry. No rash noted.   Cardiovascular: Normal heart rate noted  Respiratory: Normal respiratory effort, no problems with respiration noted  Abdomen: Soft, gravid, appropriate for gestational age. Pain/Pressure: Present     Pelvic:  Cervical exam performed Dilation: 3 Effacement (%): 80 Station: -3  Extremities: Normal range of motion.  Edema: Trace  Mental Status: Normal mood and affect. Normal behavior. Normal judgment and thought content.   Assessment and Plan:  Pregnancy: G3P2002 at 362w4d1. Not immune to rubella     MMR postpartum  2. Supervision of other normal pregnancy, antepartum     Doing well  3. Group B Streptococcus urinary tract infection affecting pregnancy in third trimester    PCN for labor/delivery  Term labor symptoms and general obstetric precautions including but not limited to vaginal  bleeding, contractions, leaking of fluid and fetal movement were reviewed in detail with the patient. Please refer to After Visit Summary for other counseling recommendations.  Return in about 1 week (around 10/05/2016) for [place]  RaMorene CrockerCNM

## 2016-09-29 ENCOUNTER — Encounter (HOSPITAL_COMMUNITY): Payer: Self-pay

## 2016-09-29 ENCOUNTER — Inpatient Hospital Stay (EMERGENCY_DEPARTMENT_HOSPITAL)
Admission: AD | Admit: 2016-09-29 | Discharge: 2016-09-29 | Disposition: A | Discharge status: Home / Self Care | Payer: BLUE CROSS/BLUE SHIELD | Source: Ambulatory Visit | Attending: Family Medicine | Admitting: Family Medicine

## 2016-09-29 DIAGNOSIS — O4693 Antepartum hemorrhage, unspecified, third trimester: Secondary | ICD-10-CM | POA: Diagnosis not present

## 2016-09-29 DIAGNOSIS — Z3493 Encounter for supervision of normal pregnancy, unspecified, third trimester: Secondary | ICD-10-CM | POA: Diagnosis not present

## 2016-09-29 DIAGNOSIS — Z3A38 38 weeks gestation of pregnancy: Secondary | ICD-10-CM | POA: Insufficient documentation

## 2016-09-29 DIAGNOSIS — O479 False labor, unspecified: Secondary | ICD-10-CM

## 2016-09-29 NOTE — MAU Provider Note (Signed)
History     CSN: [id number]  Arrival date and time: 09/29/16 4098   First Provider Initiated Contact with Patient 09/29/16 [phone]      Chief Complaint  Patient presents with  . Vaginal Bleeding  . Abdominal Pain   HPI Ms. Candace Liu is a 26 y.o. G3P2002 at [redacted]w[redacted]d who presents to MAU today with complaint of vaginal bleeding and abdominal cramping. The patient was in the office yesterday and cervix was checked. Per records it was 3 cm/80%/-3. The patient states spotting last night with mucus discharge and slight increase this morning. She denies heavy bleeding or LOF. She reports irregular contractions only. She reports good fetal movement. She denies complications with the pregnancy.   OB History    Gravida Para Term Preterm AB Living   0 0 2   SAB TAB Ectopic Multiple Live Births   0 0 0 0 2      Past Medical History:  Diagnosis Date  . Headache   . No pertinent past medical history   . Obesity   . Pseudotumor cerebri     Past Surgical History:  Procedure Laterality Date  . NO PAST SURGERIES      Family History  Problem Relation Age of Onset  . Heart disease Brother     MI  . Healthy Mother   . Healthy Father   . Migraines Neg Hx     Social History  Substance Use Topics  . Smoking status: Never Smoker  . Smokeless tobacco: Never Used  . Alcohol use Yes     Comment: socially    Allergies: No Known Allergies  Prescriptions Prior to Admission  Medication Sig Dispense Refill Last Dose  . Prenatal Vit-Fe Fumarate-FA (PRENATAL MULTIVITAMIN) TABS tablet Take 1 tablet by mouth daily at 12 noon.   Taking    Review of Systems  Gastrointestinal: Positive for abdominal pain.  Genitourinary: Positive for vaginal bleeding and vaginal discharge.   Physical Exam   Blood pressure 132/73, pulse 94, temperature 98.5 F (36.9 C), resp. rate 18, last menstrual period 01/02/2016.  Physical Exam  Nursing note and vitals reviewed. Constitutional: She is oriented  to person, place, and time. She appears well-developed and well-nourished. No distress.  HENT:  Head: Normocephalic and atraumatic.  Cardiovascular: Normal rate.   Respiratory: Effort normal.  GI: Soft. She exhibits no distension.  Genitourinary: Uterus is enlarged (gravid). Uterus is not tender. Cervix exhibits discharge (mucus). Cervix exhibits no friability. There is bleeding (small brown) in the vagina. Vaginal discharge (small mucus) found.  Neurological: She is alert and oriented to person, place, and time.  Skin: Skin is warm and dry. No erythema.  Psychiatric: She has a normal mood and affect.  Dilation: 3 Effacement (%): 80 Cervical Position: Middle Station: -3 Presentation: Vertex Exam by:: Wenzel PA   Fetal Monitoring: Baseline: 130 bpm Variability: moderate Accelerations: 15 x 15 Decelerations: none Contractions: q 7 minutes  MAU Course  Procedures None  MDM [place] reviewed from earlier in pregnancy, normal posterior placenta without previa.  NST Reactive, cervix unchanged.  Blood type: B+ Assessment and Plan  A: SIUP at [redacted]w[redacted]d Vaginal bleeding in pregnancy, third trimester Uterine contractions   P: Discharge home Labor precautions and kick counts discussed Patient advised to follow-up with [facility] as scheduled next week or sooner if symptoms worsen Patient may return to MAU as needed or if her condition were to change or worsen   Marny Lowenstein, PA-C  09/29/2016,  9:37 AM

## 2016-09-29 NOTE — MAU Note (Signed)
Pt had some bleeding last night that stopped but then started again this morning. A little heavier than spotting and was concerned so wanted to be evaluated. Having some abdominal pain 5/10

## 2016-09-29 NOTE — MAU Note (Signed)
Urine in lab 

## 2016-09-29 NOTE — Discharge Instructions (Signed)
Pelvic Rest Pelvic rest may be recommended if:  Your placenta is partially or completely covering the opening of your cervix (placenta previa).  There is bleeding between the wall of the uterus and the amniotic sac in the first trimester of pregnancy (subchorionic hemorrhage).  You went into labor too early (preterm labor). Based on your overall health and the health of your baby, your health care provider will decide if pelvic rest is right for you. How do I rest my pelvis? For as long as told by your health care provider:  Do not have sex, sexual stimulation, or an orgasm.  Do not use tampons. Do not douche. Do not put anything in your vagina.  Do not lift anything that is heavier than 10 lb (4.5 kg).  Avoid activities that take a lot of effort (are strenuous).  Avoid any activity in which your pelvic muscles could become strained. When should I seek medical care? Seek medical care if you have:  Cramping pain in your lower abdomen.  Vaginal discharge.  A low, dull backache.  Regular contractions.  Uterine tightening. When should I seek immediate medical care? Seek immediate medical care if:  You have vaginal bleeding and you are pregnant. This information is not intended to replace advice given to you by your health care provider. Make sure you discuss any questions you have with your health care provider. Document Released: 09/26/2010 Document Revised: 11/07/2015 Document Reviewed: 12/03/2014 Elsevier Interactive Patient Education  2017 Elsevier Inc.  Fetal Movement Counts Patient Name: ________________________________________________ Patient Due Date: ____________________ What is a fetal movement count? A fetal movement count is the number of times that you feel your baby move during a certain amount of time. This may also be called a fetal kick count. A fetal movement count is recommended for every pregnant woman. You may be asked to start counting fetal movements as  early as week 28 of your pregnancy. Pay attention to when your baby is most active. You may notice your baby's sleep and wake cycles. You may also notice things that make your baby move more. You should do a fetal movement count:  When your baby is normally most active.  At the same time each day. A good time to count movements is while you are resting, after having something to eat and drink. How do I count fetal movements? 1. Find a quiet, comfortable area. Sit, or lie down on your side. 2. Write down the date, the start time and stop time, and the number of movements that you felt between those two times. Take this information with you to your health care visits. 3. For 2 hours, count kicks, flutters, swishes, rolls, and jabs. You should feel at least 10 movements during 2 hours. 4. You may stop counting after you have felt 10 movements. 5. If you do not feel 10 movements in 2 hours, have something to eat and drink. Then, keep resting and counting for 1 hour. If you feel at least 4 movements during that hour, you may stop counting. Contact a health care provider if:  You feel fewer than 4 movements in 2 hours.  Your baby is not moving like he or she usually does. Date: ____________ Start time: ____________ Stop time: ____________ Movements: ____________ Date: ____________ Start time: ____________ Stop time: ____________ Movements: ____________ Date: ____________ Start time: ____________ Stop time: ____________ Movements: ____________ Date: ____________ Start time: ____________ Stop time: ____________ Movements: ____________ Date: ____________ Start time: ____________ Stop time: ____________ Movements: ____________  Date: ____________ Start time: ____________ Stop time: ____________ Movements: ____________ Date: ____________ Start time: ____________ Stop time: ____________ Movements: ____________ Date: ____________ Start time: ____________ Stop time: ____________ Movements:  ____________ Date: ____________ Start time: ____________ Stop time: ____________ Movements: ____________ This information is not intended to replace advice given to you by your health care provider. Make sure you discuss any questions you have with your health care provider. Document Released: 07/01/2006 Document Revised: 01/29/2016 Document Reviewed: 07/11/2015 Elsevier Interactive Patient Education  2017 Elsevier Inc.  [employer] of the uterus can occur throughout pregnancy, but they are not always a sign that you are in labor. You may have practice contractions called Braxton Hicks contractions. These false labor contractions are sometimes confused with true labor. What are Deberah Pelton contractions? Braxton Hicks contractions are tightening movements that occur in the muscles of the uterus before labor. Unlike true labor contractions, these contractions do not result in opening (dilation) and thinning of the cervix. Toward the end of pregnancy (32-34 weeks), Braxton Hicks contractions can happen more often and may become stronger. These contractions are sometimes difficult to tell apart from true labor because they can be very uncomfortable. You should not feel embarrassed if you go to the hospital with false labor. Sometimes, the only way to tell if you are in true labor is for your health care provider to look for changes in the cervix. The health care provider will do a physical exam and may monitor your contractions. If you are not in true labor, the exam should show that your cervix is not dilating and your water has not broken. If there are no prenatal problems or other health problems associated with your pregnancy, it is completely safe for you to be sent home with false labor. You may continue to have Braxton Hicks contractions until you go into true labor. How can I tell the difference between true labor and false labor?  Differences  False  labor  Contractions last 30-70 seconds.: Contractions are usually shorter and not as strong as true labor contractions.  Contractions become very regular.: Contractions are usually irregular.  Discomfort is usually felt in the top of the uterus, and it spreads to the lower abdomen and low back.: Contractions are often felt in the front of the lower abdomen and in the groin.  Contractions do not go away with walking.: Contractions may go away when you walk around or change positions while lying down.  Contractions usually become more intense and increase in frequency.: Contractions get weaker and are shorter-lasting as time goes on.  The cervix dilates and gets thinner.: The cervix usually does not dilate or become thin. Follow these instructions at home:  Take over-the-counter and prescription medicines only as told by your health care provider.  Keep up with your usual exercises and follow other instructions from your health care provider.  Eat and drink lightly if you think you are going into labor.  If Braxton Hicks contractions are making you uncomfortable:  Change your position from lying down or resting to walking, or change from walking to resting.  Sit and rest in a tub of warm water.  Drink enough fluid to keep your urine clear or pale yellow. Dehydration may cause these contractions.  Do slow and deep breathing several times an hour.  Keep all follow-up prenatal visits as told by your health care provider. This is important. Contact a health care provider if:  You have a fever.  You have continuous  pain in your abdomen. Get help right away if:  Your contractions become stronger, more regular, and closer together.  You have fluid leaking or gushing from your vagina.  You pass blood-tinged mucus (bloody show).  You have bleeding from your vagina.  You have low back pain that you never had before.  You feel your babys head pushing down and causing pelvic  pressure.  Your baby is not moving inside you as much as it used to. Summary  Contractions that occur before labor are called Braxton Hicks contractions, false labor, or practice contractions.  Braxton Hicks contractions are usually shorter, weaker, farther apart, and less regular than true labor contractions. True labor contractions usually become progressively stronger and regular and they become more frequent.  Manage discomfort from [facility] contractions by changing position, resting in a warm bath, drinking plenty of water, or practicing deep breathing. This information is not intended to replace advice given to you by your health care provider. Make sure you discuss any questions you have with your health care provider. Document Released: 06/01/2005 Document Revised: 04/20/2016 Document Reviewed: 04/20/2016 Elsevier Interactive Patient Education  2017 [employer].

## 2016-09-30 ENCOUNTER — Inpatient Hospital Stay (HOSPITAL_COMMUNITY)
Admission: AD | Admit: 2016-09-30 | Discharge: 2016-10-02 | DRG: 774 | Disposition: A | Discharge status: Home / Self Care | Payer: BLUE CROSS/BLUE SHIELD | Source: Ambulatory Visit | Attending: Obstetrics & Gynecology | Admitting: Obstetrics & Gynecology

## 2016-09-30 ENCOUNTER — Encounter (HOSPITAL_COMMUNITY): Payer: Self-pay | Admitting: *Deleted

## 2016-09-30 DIAGNOSIS — O9081 Anemia of the puerperium: Secondary | ICD-10-CM | POA: Diagnosis not present

## 2016-09-30 DIAGNOSIS — Z3A38 38 weeks gestation of pregnancy: Secondary | ICD-10-CM

## 2016-09-30 DIAGNOSIS — O99824 Streptococcus B carrier state complicating childbirth: Secondary | ICD-10-CM | POA: Diagnosis present

## 2016-09-30 DIAGNOSIS — Z6841 Body Mass Index (BMI) 40.0 and over, adult: Secondary | ICD-10-CM

## 2016-09-30 DIAGNOSIS — O99214 Obesity complicating childbirth: Secondary | ICD-10-CM | POA: Diagnosis present

## 2016-09-30 DIAGNOSIS — Z8249 Family history of ischemic heart disease and other diseases of the circulatory system: Secondary | ICD-10-CM

## 2016-09-30 DIAGNOSIS — D649 Anemia, unspecified: Secondary | ICD-10-CM | POA: Diagnosis not present

## 2016-09-30 DIAGNOSIS — Z3493 Encounter for supervision of normal pregnancy, unspecified, third trimester: Secondary | ICD-10-CM | POA: Diagnosis present

## 2016-09-30 LAB — TYPE AND SCREEN
ABO/RH(D): B POS
Antibody Screen: NEGATIVE

## 2016-09-30 LAB — CBC
HCT: 32.6 % — ABNORMAL LOW (ref 36.0–46.0)
Hemoglobin: 10.8 g/dL — ABNORMAL LOW (ref 12.0–15.0)
MCH: 28.6 pg (ref 26.0–34.0)
MCHC: 33.1 g/dL (ref 30.0–36.0)
MCV: 86.5 fL (ref 78.0–100.0)
PLATELETS: 246 10*3/uL (ref 150–400)
RBC: 3.77 MIL/uL — AB (ref 3.87–5.11)
RDW: 15.8 % — AB (ref 11.5–15.5)
WBC: 16.3 10*3/uL — AB (ref 4.0–10.5)

## 2016-09-30 LAB — ABO/RH: ABO/RH(D): B POS

## 2016-09-30 MED ORDER — ACETAMINOPHEN 325 MG PO TABS: 650.0000 mg | ORAL_TABLET | ORAL | Status: DC | PRN

## 2016-09-30 MED ORDER — SENNOSIDES-DOCUSATE SODIUM 8.6-50 MG PO TABS
2.0000 | ORAL_TABLET | ORAL | Status: DC
  Administered 2016-09-30 – 2016-10-01 (×2): 2 via ORAL
  Filled 2016-09-30 (×2): qty 2

## 2016-09-30 MED ORDER — LIDOCAINE HCL (PF) 1 % IJ SOLN
INTRAMUSCULAR | Status: AC
  Filled 2016-09-30: qty 30

## 2016-09-30 MED ORDER — DOCUSATE SODIUM 100 MG PO CAPS
100.0000 mg | ORAL_CAPSULE | Freq: Two times a day (BID) | ORAL | Status: DC
  Administered 2016-09-30 – 2016-10-02 (×4): 100 mg via ORAL
  Filled 2016-09-30 (×4): qty 1

## 2016-09-30 MED ORDER — OXYTOCIN 10 UNIT/ML IJ SOLN
INTRAMUSCULAR | Status: AC
  Filled 2016-09-30: qty 1

## 2016-09-30 MED ORDER — METHYLERGONOVINE MALEATE 0.2 MG/ML IJ SOLN
0.2000 mg | Freq: Once | INTRAMUSCULAR | Status: AC
  Administered 2016-09-30: 0.2 mg via INTRAMUSCULAR

## 2016-09-30 MED ORDER — OXYCODONE-ACETAMINOPHEN 5-325 MG PO TABS
1.0000 | ORAL_TABLET | ORAL | Status: DC | PRN
  Administered 2016-09-30 (×2): 1 via ORAL
  Filled 2016-09-30 (×2): qty 1

## 2016-09-30 MED ORDER — COCONUT OIL OIL: 1.0000 "application " | TOPICAL_OIL | Status: DC | PRN

## 2016-09-30 MED ORDER — DIPHENHYDRAMINE HCL 25 MG PO CAPS: 25.0000 mg | ORAL_CAPSULE | Freq: Four times a day (QID) | ORAL | Status: DC | PRN

## 2016-09-30 MED ORDER — LACTATED RINGERS IV SOLN: 500.0000 mL | INTRAVENOUS | Status: DC | PRN

## 2016-09-30 MED ORDER — IBUPROFEN 600 MG PO TABS
600.0000 mg | ORAL_TABLET | Freq: Four times a day (QID) | ORAL | Status: DC
  Administered 2016-09-30 – 2016-10-02 (×7): 600 mg via ORAL
  Filled 2016-09-30 (×7): qty 1

## 2016-09-30 MED ORDER — BENZOCAINE-MENTHOL 20-0.5 % EX AERO
1.0000 "application " | INHALATION_SPRAY | CUTANEOUS | Status: DC | PRN
  Administered 2016-09-30: 1 via TOPICAL
  Filled 2016-09-30: qty 56

## 2016-09-30 MED ORDER — OXYTOCIN BOLUS FROM INFUSION
500.0000 mL | Freq: Once | INTRAVENOUS | Status: AC
  Administered 2016-09-30: 500 mL via INTRAVENOUS

## 2016-09-30 MED ORDER — OXYTOCIN 10 UNIT/ML IJ SOLN
10.0000 [IU] | Freq: Once | INTRAMUSCULAR | Status: AC
  Administered 2016-09-30: 10 [IU] via INTRAMUSCULAR

## 2016-09-30 MED ORDER — OXYTOCIN 40 UNITS IN LACTATED RINGERS INFUSION - SIMPLE MED: 2.5000 [IU]/h | INTRAVENOUS | Status: DC

## 2016-09-30 MED ORDER — MEASLES, MUMPS & RUBELLA VAC ~~LOC~~ INJ
0.5000 mL | INJECTION | Freq: Once | SUBCUTANEOUS | Status: DC
  Filled 2016-09-30: qty 0.5

## 2016-09-30 MED ORDER — IBUPROFEN 600 MG PO TABS
600.0000 mg | ORAL_TABLET | Freq: Four times a day (QID) | ORAL | Status: DC | PRN
  Administered 2016-09-30: 600 mg via ORAL
  Filled 2016-09-30: qty 1

## 2016-09-30 MED ORDER — DIBUCAINE 1 % RE OINT
1.0000 | TOPICAL_OINTMENT | RECTAL | Status: DC | PRN
Start: 2016-09-30 — End: 2016-10-02

## 2016-09-30 MED ORDER — SIMETHICONE 80 MG PO CHEW
80.0000 mg | CHEWABLE_TABLET | ORAL | Status: DC | PRN
  Administered 2016-10-02: 80 mg via ORAL
  Filled 2016-09-30: qty 1

## 2016-09-30 MED ORDER — METHYLERGONOVINE MALEATE 0.2 MG/ML IJ SOLN
INTRAMUSCULAR | Status: AC
  Administered 2016-09-30: 0.2 mg
  Filled 2016-09-30: qty 1

## 2016-09-30 MED ORDER — ONDANSETRON HCL 4 MG/2ML IJ SOLN: 4.0000 mg | INTRAMUSCULAR | Status: DC | PRN

## 2016-09-30 MED ORDER — PRENATAL MULTIVITAMIN CH
1.0000 | ORAL_TABLET | Freq: Every day | ORAL | Status: DC
  Administered 2016-10-01: 1 via ORAL
  Filled 2016-09-30: qty 1

## 2016-09-30 MED ORDER — ACETAMINOPHEN 325 MG PO TABS
650.0000 mg | ORAL_TABLET | ORAL | Status: DC | PRN
  Administered 2016-09-30 – 2016-10-02 (×2): 650 mg via ORAL
  Filled 2016-09-30 (×2): qty 2

## 2016-09-30 MED ORDER — ONDANSETRON HCL 4 MG PO TABS
4.0000 mg | ORAL_TABLET | ORAL | Status: DC | PRN
  Administered 2016-10-01 (×2): 4 mg via ORAL
  Filled 2016-09-30 (×2): qty 1

## 2016-09-30 MED ORDER — ONDANSETRON HCL 4 MG/2ML IJ SOLN: 4.0000 mg | Freq: Four times a day (QID) | INTRAMUSCULAR | Status: DC | PRN

## 2016-09-30 MED ORDER — LACTATED RINGERS IV SOLN: INTRAVENOUS | Status: DC

## 2016-09-30 MED ORDER — WITCH HAZEL-GLYCERIN EX PADS: 1.0000 "application " | MEDICATED_PAD | CUTANEOUS | Status: DC | PRN

## 2016-09-30 MED ORDER — OXYCODONE-ACETAMINOPHEN 5-325 MG PO TABS
1.0000 | ORAL_TABLET | ORAL | Status: DC | PRN
  Administered 2016-09-30 – 2016-10-02 (×2): 1 via ORAL
  Filled 2016-09-30 (×2): qty 1

## 2016-09-30 MED ORDER — OXYTOCIN 40 UNITS IN LACTATED RINGERS INFUSION - SIMPLE MED: 1.0000 m[IU]/min | INTRAVENOUS | Status: DC

## 2016-09-30 MED ORDER — OXYTOCIN 40 UNITS IN LACTATED RINGERS INFUSION - SIMPLE MED
INTRAVENOUS | Status: AC
Start: 2016-09-30 — End: 2016-09-30
  Filled 2016-09-30: qty 1000

## 2016-09-30 MED ORDER — OXYCODONE-ACETAMINOPHEN 5-325 MG PO TABS: 2.0000 | ORAL_TABLET | ORAL | Status: DC | PRN

## 2016-09-30 MED ORDER — TETANUS-DIPHTH-ACELL PERTUSSIS 5-2.5-18.5 LF-MCG/0.5 IM SUSP
0.5000 mL | Freq: Once | INTRAMUSCULAR | Status: DC
  Filled 2016-09-30: qty 0.5

## 2016-09-30 MED ORDER — ZOLPIDEM TARTRATE 5 MG PO TABS: 5.0000 mg | ORAL_TABLET | Freq: Every evening | ORAL | Status: DC | PRN

## 2016-09-30 MED ORDER — METHYLERGONOVINE MALEATE 0.2 MG/ML IJ SOLN: 0.2000 mg | Freq: Once | INTRAMUSCULAR | Status: DC

## 2016-09-30 NOTE — Progress Notes (Signed)
Dr. Macon Large in to perform manual evacuation of uterus.  Reports lower uterine segment atony.  Orders for methergine IM.

## 2016-09-30 NOTE — Progress Notes (Signed)
Patient to room in 110 MBE, will pass on EBL of 650 and uterine position to oncoming RN.

## 2016-09-30 NOTE — H&P (Addendum)
Obstetric History and Physical  Candace Liu is a 26 y.o. [id number] with IUP at [redacted]w[redacted]d who presented to MAU in active labor.. Patient states she has been having  regular, every 2-3 minutes contractions since this morning, minimal vaginal bleeding, intact membranes, with active fetal movement.    Prenatal Course Source of Care: [facility] Pregnancy complications or risks: Patient Active Problem List   Diagnosis Date Noted  . Supervision of normal pregnancy, antepartum 05/11/2016  . Not immune to rubella 04/14/2016  . GBS (group B streptococcus) UTI complicating pregnancy 04/14/2016    Clinic [facility] Prenatal Labs  Dating LMP and 6.2 week [place] Blood type: B/Positive/-- (10/03 1326)   Genetic Screen AFP:   declined    NIPS: Antibody:Negative (10/03 1326)  Anatomic [place] Normal 18 week; female fetus; dating LMP Rubella: <0.90 (10/03 1326)  GTT  Third trimester: WNL RPR: Non Reactive (01/24 1030)   Flu vaccine 03-17-16 HBsAg: Negative (10/03 1326)   TDaP vaccine  08/10/16                                         Rhogam:n/a B+ HIV: Non Reactive (01/24 1030)   Baby Food  Breast/Bottle                                            GBS: Positive urine  Contraception Unsure Pap: Negative (03/2016)  Circumcision girl   Pediatrician [facility]   Support Person FOB     Past Medical History:  Diagnosis Date  . Headache   . No pertinent past medical history   . Obesity   . Pseudotumor cerebri     Past Surgical History:  Procedure Laterality Date  . NO PAST SURGERIES      OB History  Gravida Para Term Preterm AB Living  0 0 2  SAB TAB Ectopic Multiple Live Births  0 0 0 0 2    # Outcome Date GA Lbr Len/2nd Weight Sex Delivery Anes PTL Lv  3 Current           2 Term 08/21/11 [redacted]w[redacted]d 192:15 / 00:03 8 lb 6.8 oz (3.82 kg) F Vag-Spont EPI  LIV     Birth Comments: wnl  1 Term 11/29/06     Vag-Spont         Social History   Social History  . Marital status: Single    Spouse name:  N/A  . Number of children: 2  . Years of education: college   Social History Main Topics  . Smoking status: Never Smoker  . Smokeless tobacco: Never Used  . Alcohol use Yes     Comment: socially  . Drug use: No  . Sexual activity: Yes   Other Topics Concern  . Not on file   Social History Narrative   Patient does not drink caffeine but take green tea vitamins.   Patient is right handed.    Family History  Problem Relation Age of Onset  . Heart disease Brother     MI  . Healthy Mother   . Healthy Father   . Migraines Neg Hx     Prescriptions Prior to Admission  Medication Sig Dispense Refill Last Dose  . Prenatal Vit-Fe Fumarate-FA (PRENATAL MULTIVITAMIN) TABS  tablet Take 1 tablet by mouth daily at 12 noon.   Taking    No Known Allergies  Review of Systems: Negative except for what is mentioned in HPI.  Physical Exam: LMP 01/02/2016  CONSTITUTIONAL: Well-developed, well-nourished female in some distress due to contractions.  HENT:  Normocephalic, atraumatic, External right and left ear normal. Oropharynx is clear and moist EYES: Conjunctivae and EOM are normal. Pupils are equal, round, and reactive to light. No scleral icterus.  NECK: Normal range of motion, supple, no masses SKIN: Skin is warm and dry. No rash noted. Not diaphoretic. No erythema. No pallor. NEUROLOGIC: Alert and oriented to person, place, and time. Normal reflexes, muscle tone coordination. No cranial nerve deficit noted. PSYCHIATRIC: Normal mood and affect. Normal behavior. Normal judgment and thought content. CARDIOVASCULAR: Normal heart rate noted RESPIRATORY: No problems with respiration noted ABDOMEN: Soft, nontender, nondistended, gravid. MUSCULOSKELETAL: Normal range of motion. No edema and no tenderness.  Cervical Exam: Dilatation 10cm   Effacement 100%   Station +3   Presentation: cephalic FHR:  Baseline rate 136 on doppler  Contractions: Every 2-3 mins   Pertinent Labs/Studies:   No  results found for this or any previous visit (from the past 24 hour(s)).  Assessment : Candace Liu is a 26 y.o. G3P2002 at [redacted]w[redacted]d being admitted in second stage pof labor  Plan: Imminent delivery expected; patient to be taken to L&D now. Will not have time to be treated for GBS.   Jaynie Collins, MD, FACOG Attending Obstetrician & Gynecologist Faculty Practice, [facility]

## 2016-09-30 NOTE — MAU Note (Signed)
Pt presents to MAU with complaints of contractions, states " the baby is coming"  VE Pt complete with urge to push, L&D called for bed, FHR 136

## 2016-09-30 NOTE — Progress Notes (Signed)
Faculty Practice OB/GYN Attending Note   Subjective:  Called to evaluate patient with increased bleeding s/p SVD.  Already receiving IV pitocin infusion.  Patient denies any symptoms, baby doing well.      Objective:  Patient Vitals for the past 24 hrs:  BP Pulse Resp  09/30/16 1056 128/81 83 -  09/30/16 1046 (!) 135/92 90 -  09/30/16 1031 (!) 145/92 80 -  09/30/16 1022 (!) 146/81 81 18  Gen: NAD HENT: Normocephalic, atraumatic Lungs: Normal respiratory effort Heart: Regular rate noted Abdomen: NT, soft, firm fundus  Pelvic:  ~400 ml of clots and red blood evacuated from atonic lower uterine segment (LUS) Ext: 2+ DTRs, no edema, no cyanosis, negative Homan's sign  Assessment & Plan:  26 y.o. [id number] s/p SVD now with PPH due to LUS atony. - Continue Pitocin  Infusion - Will give Methergine IM x 1 dose - Will follow up ordered labs and determine need for further labs at that point.  Continue close observation.  Jaynie Collins, MD, FACOG Attending Obstetrician & Gynecologist Faculty Practice, [facility]

## 2016-09-30 NOTE — Lactation Note (Signed)
This note was copied from a baby's chart. Lactation Consultation Note  Patient Name: Candace Liu [id number] Date: 09/30/2016 Reason for consult: Initial assessment   Initial assessment with mom of < 1 hour old infant in [place]. Mom asked me to call her nurse as she felt a gush of fluid, RN was called to room.  Mom reports she plans to breast and bottle feed. Infant STS with mom and cueing to feed. Mom reported she did not want to latch infant until she gets some pain medication. Enc mom to place infant to breast since infant cueing to feed, mom agreed. Infant on for a few minutes and RN and MD in room to assess mom and perform exam, mom was not able to keep infant on the breast. Infant swaddled and handed to GM and LC left room.   Mom will need follow up for initial teaching. She reports she BF her first child just in the hospital and her second child for a few months. She had difficulty with nipple pain.   Mom with large compressible breast and everted nipples. Showed mom how to hand express and no colostrum expressed from either breast. Mom is concerned she does not have enough milk, discussed baby is the best pump.    Maternal Data Formula Feeding for Exclusion: Yes Has patient been taught Hand Expression?: Yes Does the patient have breastfeeding experience prior to this delivery?: Yes  Feeding Feeding Type: Breast Fed Length of feed: 5 min  LATCH Score/Interventions Latch: Grasps breast easily, tongue down, lips flanged, rhythmical sucking.  Audible Swallowing: Spontaneous and intermittent  Type of Nipple: Everted at rest and after stimulation  Comfort (Breast/Nipple): Soft / non-tender     Hold (Positioning): Assistance needed to correctly position infant at breast and maintain latch. Intervention(s): Breastfeeding basics reviewed;Support Pillows;Position options;Skin to skin  LATCH Score: 9  Lactation Tools Discussed/Used WIC Program: Yes   Consult  Status Consult Status: Follow-up Date: 09/30/16 Follow-up type: In-patient    Silas Flood Hice 09/30/2016, 10:52 AM

## 2016-09-30 NOTE — Progress Notes (Signed)
Arrived at room 163 to assist in recovery and to monitor for PPH symptoms.

## 2016-09-30 NOTE — Progress Notes (Signed)
Father of baby has arrived with food.  Baby placed to warmer to bring up temp. Mother is going to try to eat a little prior to transport, will monitor uterus and bleeding situation.  No further issues at this point.  Baby has received Hep B and Vit K while in room.

## 2016-10-01 LAB — CBC
HEMATOCRIT: 28.9 % — AB (ref 36.0–46.0)
Hemoglobin: 9.5 g/dL — ABNORMAL LOW (ref 12.0–15.0)
MCH: 28.6 pg (ref 26.0–34.0)
MCHC: 32.9 g/dL (ref 30.0–36.0)
MCV: 87 fL (ref 78.0–100.0)
Platelets: 229 10*3/uL (ref 150–400)
RBC: 3.32 MIL/uL — ABNORMAL LOW (ref 3.87–5.11)
RDW: 16 % — AB (ref 11.5–15.5)
WBC: 8.5 10*3/uL (ref 4.0–10.5)

## 2016-10-01 LAB — RPR: RPR: NONREACTIVE

## 2016-10-01 MED ORDER — METHYLERGONOVINE MALEATE 0.2 MG PO TABS
0.2000 mg | ORAL_TABLET | Freq: Three times a day (TID) | ORAL | Status: AC
  Administered 2016-10-01 – 2016-10-02 (×4): 0.2 mg via ORAL
  Filled 2016-10-01 (×4): qty 1

## 2016-10-01 MED ORDER — MEASLES, MUMPS & RUBELLA VAC ~~LOC~~ INJ
0.5000 mL | INJECTION | Freq: Once | SUBCUTANEOUS | Status: AC
  Administered 2016-10-02: 0.5 mL via SUBCUTANEOUS
  Filled 2016-10-01 (×2): qty 0.5

## 2016-10-01 MED ORDER — FERROUS SULFATE 325 (65 FE) MG PO TABS
325.0000 mg | ORAL_TABLET | Freq: Two times a day (BID) | ORAL | Status: DC
  Administered 2016-10-01 – 2016-10-02 (×3): 325 mg via ORAL
  Filled 2016-10-01 (×3): qty 1

## 2016-10-01 MED ORDER — DIPHENOXYLATE-ATROPINE 2.5-0.025 MG PO TABS: 2.0000 | ORAL_TABLET | Freq: Four times a day (QID) | ORAL | Status: DC | PRN

## 2016-10-01 NOTE — Progress Notes (Signed)
patient given MMR VIS 

## 2016-10-01 NOTE — Lactation Note (Signed)
This note was copied from a baby's chart. Lactation Consultation Note  Mom reported to mother that she was formula feeding because she was waiting for her milk to "come in". She was discouraged because she pumped once and did not yield any milk. Explained that was normal and offer to help with hand expression and positioning. Colostrum was expressed and with good positioning baby latched easily. Mom was encouraged by the outcome. Advised to always BF before offering any formula.  Follow-up tomorrow or sooner if desired.  Patient Name: Candace Liu [id number] Date: 10/01/2016 Reason for consult: Follow-up assessment   Maternal Data    Feeding Feeding Type: Breast Fed  LATCH Score/Interventions Latch: Repeated attempts needed to sustain latch, nipple held in mouth throughout feeding, stimulation needed to elicit sucking reflex.  Audible Swallowing: A few with stimulation  Type of Nipple: Everted at rest and after stimulation  Comfort (Breast/Nipple): Soft / non-tender     Hold (Positioning): Assistance needed to correctly position infant at breast and maintain latch.  LATCH Score: 7  Lactation Tools Discussed/Used     Consult Status Consult Status: Follow-up Date: 10/02/16 Follow-up type: In-patient    Soyla Dryer 10/01/2016, 12:18 PM

## 2016-10-01 NOTE — Progress Notes (Signed)
Post Partum Day #1 Subjective: up ad lib, voiding, tolerating PO and reports some cramping.    Objective: Blood pressure 122/83, pulse 81, temperature 98.5 F (36.9 C), temperature source Oral, resp. rate 18, height  (1.6 m), weight 257 lb (116.6 kg), last menstrual period 01/02/2016, SpO2 99 %, unknown if currently breastfeeding.  Physical Exam:  General: alert, cooperative and no distress Lochia: appropriate, moderate flow noted, no clots Uterine Fundus: firm Incision: none DVT Evaluation: No evidence of DVT seen on physical exam. No cords or calf tenderness. No significant calf/ankle edema.   Recent Labs  09/30/16 1424  HGB 10.8*  HCT 32.6*    Assessment/Plan: Plan for discharge tomorrow, Breastfeeding, Lactation consult and Contraception undecided. PPH delayed 09/30/16: f/u CBC ordered.  Methergine series PO started. Lactation consult ordered.    LOS: 1 day   Roe Coombs, CNM 10/01/2016, 7:23 AM

## 2016-10-02 MED ORDER — FERROUS SULFATE 325 (65 FE) MG PO TABS: 325.0000 mg | ORAL_TABLET | Freq: Two times a day (BID) | ORAL | 1 refills | Status: DC

## 2016-10-02 MED ORDER — IBUPROFEN 600 MG PO TABS: 600.0000 mg | ORAL_TABLET | Freq: Four times a day (QID) | ORAL | 0 refills | Status: DC

## 2016-10-02 NOTE — Discharge Instructions (Signed)

## 2016-10-02 NOTE — Lactation Note (Signed)
This note was copied from a baby's chart. Lactation Consultation Note  Patient Name: Girl Sahirah Rudell [id number] Date: 10/02/2016 Reason for consult: Follow-up assessment Baby at 49 hr of life and dyad set for d/c today. Mom is not pumping or latching baby. She desires to wait until her milk comes in to offer it to baby. She prefers to pump. Explained the importance of early and frequent stimulation, breast changes, and nipple care. Parents are aware of lactation services and support group. They will call as needed.   Maternal Data    Feeding Feeding Type: Bottle Fed - Formula  LATCH Score/Interventions                      Lactation Tools Discussed/Used     Consult Status Consult Status: Complete    Rulon Eisenmenger 10/02/2016, 11:22 AM

## 2016-10-02 NOTE — Discharge Summary (Signed)
OB Discharge Summary  Patient Name: Candace Liu DOB: 1990-07-22 MRN: [id number]  Date of admission: 09/30/2016 Delivering MD: Jaynie Collins A   Date of discharge: 10/02/2016  Admitting diagnosis: LABOR Intrauterine pregnancy: [redacted]w[redacted]d     Secondary diagnosis:Active Problems:   Precipitous delivery, delivered (current hospitalization)   NSVD (normal spontaneous vaginal delivery)   Postpartum hemorrhage  Additional problems:morbid obesity , + GBS, precipitous delivery with no time to treat GBS    Discharge diagnosis: Term Pregnancy Delivered and anemia                                                                     Post partum procedures:postpartum hemorrhage, treated with methergine   Hospital course:  Onset of Labor With Vaginal Delivery     26 y.o. yo G3P3003 at [redacted]w[redacted]d was admitted in Active Labor on 09/30/2016. Patient had an uncomplicated labor course as follows:  Membrane Rupture Time/Date: 9:53 AM ,09/30/2016   Intrapartum Procedures: Episiotomy: None [1]                                         Lacerations:  None [1]  Patient had a delivery of a Viable infant. 09/30/2016  Information for the patient's newborn:  Deisy, Ozbun [[id number]]  Delivery Method: Vaginal, Spontaneous Delivery (Filed from Delivery Summary)    Pateint had an uncomplicated postpartum course.  She is ambulating, tolerating a regular diet, passing flatus, and urinating well. Patient is discharged home in stable condition on 10/02/16.   Physical exam  Vitals:   09/30/16 1734 10/01/16 0013 10/01/16 0545 10/01/16 1754  BP: (!) 147/82 (!) 143/76 122/83 120/73  Pulse: 85 83 81 80  Resp: Temp: 98 F (36.7 C) 98.3 F (36.8 C) 98.5 F (36.9 C) 98.5 F (36.9 C)  TempSrc: Oral Oral Oral Oral  SpO2: 100% 99%  100%  Weight:      Height:       General: alert Lochia: appropriate Uterine Fundus: firm and NT at U-1 Incision: N/A DVT Evaluation: No evidence of DVT seen on  physical exam. Labs: Lab Results  Component Value Date   WBC 8.5 10/01/2016   HGB 9.5 (L) 10/01/2016   HCT 28.9 (L) 10/01/2016   MCV 87.0 10/01/2016   PLT 229 10/01/2016   CMP Latest Ref Rng & Units 05/19/2016  Glucose 65 - 99 mg/dL 77  BUN 6 - 20 mg/dL 5(L)  Creatinine 0.86 - 1.00 mg/dL 5.78  Sodium 469 - 629 mmol/L 136  Potassium 3.5 - 5.1 mmol/L 3.8  Chloride 101 - 111 mmol/L 107  CO2 22 - 32 mmol/L 22  Calcium 8.9 - 10.3 mg/dL 5.2(W)  Total Protein 6.5 - 8.1 g/dL 6.2(L)  Total Bilirubin 0.3 - 1.2 mg/dL 4.1(L)  Alkaline Phos 38 - 126 U/L 47  AST 15 - 41 U/L 23  ALT 14 - 54 U/L 28    Discharge instruction: per After Visit Summary and "Baby and Me Booklet".  After Visit Meds:  Allergies as of 10/02/2016   No Known Allergies     Medication List    TAKE  these medications   ferrous sulfate 325 (65 FE) MG tablet Take 1 tablet (325 mg total) by mouth 2 (two) times daily with a meal.   ibuprofen 600 MG tablet Commonly known as:  ADVIL,MOTRIN Take 1 tablet (600 mg total) by mouth every 6 (six) hours.   prenatal multivitamin Tabs tablet Take 1 tablet by mouth daily at 12 noon.       Diet: routine diet  Activity: Advance as tolerated. Pelvic rest for 6 weeks.   Outpatient follow up:6 weeks Follow up Appt:No future appointments. Follow up visit: No Follow-up on file.  Postpartum contraception: Abstinence  Newborn Data: Live born female  Birth Weight: 7 lb 1.8 oz (3226 g) APGAR: 9, 9  Baby Feeding: breast and bottle Disposition:home with mother   10/02/2016 Allie Bossier, MD

## 2016-10-08 ENCOUNTER — Encounter: Payer: BLUE CROSS/BLUE SHIELD | Admitting: Certified Nurse Midwife

## 2016-11-16 ENCOUNTER — Encounter: Payer: Self-pay | Admitting: Certified Nurse Midwife

## 2016-11-16 ENCOUNTER — Ambulatory Visit (INDEPENDENT_AMBULATORY_CARE_PROVIDER_SITE_OTHER): Payer: BLUE CROSS/BLUE SHIELD | Admitting: Certified Nurse Midwife

## 2016-11-16 VITALS — BP 120/78 | HR 75 | Wt 236.5 lb

## 2016-11-16 DIAGNOSIS — O9081 Anemia of the puerperium: Secondary | ICD-10-CM

## 2016-11-16 NOTE — Progress Notes (Signed)
Subjective:     Candace Liu is a 26 y.o. female who presents for a postpartum visit. She is 6 weeks postpartum following a spontaneous vaginal delivery. I have fully reviewed the prenatal and intrapartum course. The delivery was at 38.6 gestational weeks. Outcome: spontaneous vaginal delivery. Anesthesia: none. Postpartum course has been normal. Baby's course has been normal. Baby is feeding by bottle - Similac Advance. Bleeding no bleeding. Bowel function is normal. Bladder function is normal. Patient is not sexually active. Contraception method is abstinence. Postpartum depression screening: negative.  Tobacco, alcohol and substance abuse history reviewed.  Adult immunizations reviewed including TDAP, rubella and varicella.  Is on her period today.    The following portions of the patient's history were reviewed and updated as appropriate: allergies, current medications, past family history, past medical history, past social history, past surgical history and problem list.  Review of Systems Pertinent items noted in HPI and remainder of comprehensive ROS otherwise negative.   Objective:    BP 120/78   Pulse 75   Wt 236 lb 8 oz (107.3 kg)   LMP 11/16/2016   Breastfeeding? No   BMI 41.89 kg/m   General:  alert, cooperative and no distress   Breasts:  inspection negative, no nipple discharge or bleeding, no masses or nodularity palpable  Lungs: clear to auscultation bilaterally  Heart:  regular rate and rhythm, S1, S2 normal, no murmur, click, rub or gallop  Abdomen: soft, non-tender; bowel sounds normal; no masses,  no organomegaly  pelvic Exam: Not performed.          50% of 20 min visit spent on counseling and coordination of care.   Assessment:     Normal 6 week postpartum exam. Pap smear not done at today's visit.     Contraception counseling: ? Kyleena IUD   CBC today for hx of anemia  Plan:    1. Contraception: abstinence 2.   F/U later this week for IUD placement 3.  Follow up in: a few days or as needed.  2hr GTT for h/o GDM/screening for DM q 3 yrs per ADA recommendations Preconception counseling provided Healthy lifestyle practices reviewed

## 2016-11-16 NOTE — Progress Notes (Signed)
Patient is in the office and wants to discuss possible tubal, or BC options.

## 2016-11-17 LAB — CBC
Hematocrit: 37.7 % (ref 34.0–46.6)
Hemoglobin: 12.1 g/dL (ref 11.1–15.9)
MCH: 27.8 pg (ref 26.6–33.0)
MCHC: 32.1 g/dL (ref 31.5–35.7)
MCV: 87 fL (ref 79–97)
PLATELETS: 352 10*3/uL (ref 150–379)
RBC: 4.36 x10E6/uL (ref 3.77–5.28)
RDW: 16.4 % — AB (ref 12.3–15.4)
WBC: 6.7 10*3/uL (ref 3.4–10.8)

## 2016-11-25 ENCOUNTER — Ambulatory Visit: Payer: BLUE CROSS/BLUE SHIELD | Admitting: Certified Nurse Midwife

## 2018-11-25 ENCOUNTER — Telehealth: Payer: Self-pay

## 2018-11-25 NOTE — Telephone Encounter (Signed)
error 

## 2020-02-08 ENCOUNTER — Ambulatory Visit
Admission: EM | Admit: 2020-02-08 | Discharge: 2020-02-08 | Disposition: A | Discharge status: Home / Self Care | Payer: BC Managed Care – PPO | Attending: Family Medicine | Admitting: Family Medicine

## 2020-02-08 ENCOUNTER — Other Ambulatory Visit: Payer: Self-pay

## 2020-02-08 DIAGNOSIS — R200 Anesthesia of skin: Secondary | ICD-10-CM

## 2020-02-08 DIAGNOSIS — R202 Paresthesia of skin: Secondary | ICD-10-CM

## 2020-02-08 MED ORDER — PREDNISONE 10 MG (21) PO TBPK: ORAL_TABLET | Freq: Every day | ORAL | 0 refills | Status: AC

## 2020-02-08 NOTE — ED Triage Notes (Signed)
Pt presents with complaints of chest discomfort and arm numbness after eating. Last night it happened after eating a burger. Reports the feeling goes away when sitting up. Reports now her entire body feels like pins and needles but denies the chest discomfort. Denies trying any otc medications. Reports history of pancreas issue.

## 2020-02-08 NOTE — ED Provider Notes (Signed)
[facility]   [id number] 02/08/20 Arrival Time: 1258  VO:ZDGUY PAIN  SUBJECTIVE: History from: patient. Candace Liu is a 29 y.o. female complains of numbness and tingling to her arms and legs. Has hx psuedotumor cerebri and reports that she has had excess spinal fluid drained in the past. Has tried OTC medications without relief. Symptoms are made worse with activity. Denies similar symptoms in the past.  Denies fever, chills, erythema, ecchymosis, effusion, weakness, saddle paresthesias, loss of bowel or bladder function.      ROS: As per HPI.  All other pertinent ROS negative.     Past Medical History:  Diagnosis Date  . Headache   . No pertinent past medical history   . Obesity   . Pseudotumor cerebri    Past Surgical History:  Procedure Laterality Date  . NO PAST SURGERIES     No Known Allergies No current facility-administered medications on file prior to encounter.   Current Outpatient Medications on File Prior to Encounter  Medication Sig Dispense Refill  . ferrous sulfate 325 (65 FE) MG tablet Take 1 tablet (325 mg total) by mouth 2 (two) times daily with a meal. 60 tablet 1  . ibuprofen (ADVIL,MOTRIN) 600 MG tablet Take 1 tablet (600 mg total) by mouth every 6 (six) hours. 30 tablet 0  . Prenatal Vit-Fe Fumarate-FA (PRENATAL MULTIVITAMIN) TABS tablet Take 1 tablet by mouth daily at 12 noon.     Social History   Socioeconomic History  . Marital status: Single    Spouse name: Not on file  . Number of children: 2  . Years of education: college  . Highest education level: Not on file  Occupational History  . Not on file  Tobacco Use  . Smoking status: Never Smoker  . Smokeless tobacco: Never Used  Substance and Sexual Activity  . Alcohol use: Yes    Comment: socially  . Drug use: No  . Sexual activity: Yes  Other Topics Concern  . Not on file  Social History Narrative   Patient does not drink caffeine but take green tea vitamins.   Patient is  right handed.   Social Determinants of Health   Financial Resource Strain:   . Difficulty of Paying Living Expenses: Not on file  Food Insecurity:   . Worried About Programme researcher, broadcasting/film/video in the Last Year: Not on file  . Ran Out of Food in the Last Year: Not on file  Transportation Needs:   . Lack of Transportation (Medical): Not on file  . Lack of Transportation (Non-Medical): Not on file  Physical Activity:   . Days of Exercise per Week: Not on file  . Minutes of Exercise per Session: Not on file  Stress:   . Feeling of Stress : Not on file  Social Connections:   . Frequency of Communication with Friends and Family: Not on file  . Frequency of Social Gatherings with Friends and Family: Not on file  . Attends Religious Services: Not on file  . Active Member of Clubs or Organizations: Not on file  . Attends Banker Meetings: Not on file  . Marital Status: Not on file  Intimate Partner Violence:   . Fear of Current or Ex-Partner: Not on file  . Emotionally Abused: Not on file  . Physically Abused: Not on file  . Sexually Abused: Not on file   Family History  Problem Relation Age of Onset  . Heart disease Brother  MI  . Healthy Mother   . Healthy Father   . Migraines Neg Hx     OBJECTIVE:  Vitals:   02/08/20 1327  BP: 112/75  Pulse: 70  Resp: 19  Temp: 98.2 F (36.8 C)  SpO2: 96%    General appearance: ALERT; in no acute distress.  Head: NCAT Lungs: Normal respiratory effort CV: pulses 2+ bilaterally. Cap refill < 2 seconds Musculoskeletal:  Inspection: Skin warm, dry, clear and intact without obvious erythema, effusion, or ecchymosis.  Palpation: Nontender to palpation ROM: FROM active and passive Skin: warm and dry Neurologic: Ambulates without difficulty; Sensation intact about the upper/ lower extremities Psychological: alert and cooperative; normal mood and affect  DIAGNOSTIC STUDIES:  No results found.   ASSESSMENT & PLAN:  1.  Numbness and tingling     Meds ordered this encounter  Medications  . predniSONE (STERAPRED UNI-PAK 21 TAB) 10 MG (21) TBPK tablet    Sig: Take by mouth daily for 6 days. Take 6 tablets on day 1, 5 tablets on day 2, 4 tablets on day 3, 3 tablets on day 4, 2 tablets on day 5, 1 tablet on day 6    Dispense:  21 tablet    Refill:  0    Order Specific Question:   Supervising Provider    Answer:   Merrilee Jansky [[id number]]    Prescribed prednisone taper Discussed with patient that she would be best served in the ER where she could have imaging performed as well as measure her CSF Symptoms could be a manifestation of increased ICP Verbalized understanding and agrees with treatment plan Will go to ER in personal vehicle        Moshe Cipro, NP 02/08/20 1823

## 2020-02-08 NOTE — Discharge Instructions (Addendum)
I recommend that you follow up in the ER for further evaluation and treatment

## 2020-03-21 ENCOUNTER — Other Ambulatory Visit: Payer: Self-pay

## 2020-03-21 ENCOUNTER — Emergency Department (HOSPITAL_COMMUNITY)
Admission: EM | Admit: 2020-03-21 | Discharge: 2020-03-21 | Disposition: A | Discharge status: Home / Self Care | Payer: BC Managed Care – PPO | Attending: Emergency Medicine | Admitting: Emergency Medicine

## 2020-03-21 ENCOUNTER — Encounter (HOSPITAL_COMMUNITY): Payer: Self-pay

## 2020-03-21 DIAGNOSIS — H6121 Impacted cerumen, right ear: Secondary | ICD-10-CM | POA: Insufficient documentation

## 2020-03-21 DIAGNOSIS — H60501 Unspecified acute noninfective otitis externa, right ear: Secondary | ICD-10-CM

## 2020-03-21 DIAGNOSIS — H6091 Unspecified otitis externa, right ear: Secondary | ICD-10-CM | POA: Diagnosis not present

## 2020-03-21 DIAGNOSIS — H9201 Otalgia, right ear: Secondary | ICD-10-CM | POA: Diagnosis present

## 2020-03-21 MED ORDER — CIPROFLOXACIN-HYDROCORTISONE 0.2-1 % OT SUSP: 3.0000 [drp] | Freq: Two times a day (BID) | OTIC | 0 refills | Status: DC

## 2020-03-21 MED ORDER — IBUPROFEN 800 MG PO TABS
800.0000 mg | ORAL_TABLET | Freq: Once | ORAL | Status: AC
Start: 2020-03-21 — End: 2020-03-21
  Administered 2020-03-21: 800 mg via ORAL
  Filled 2020-03-21: qty 1

## 2020-03-21 NOTE — Discharge Instructions (Addendum)
Use antibiotic eardrops for symptoms.  You may take this with 600 mg ibuprofen every 6 hours for management of pain.  We recommend follow-up with the ENT to ensure that symptoms resolve, especially given your ongoing issues with your ears.  Return to the ED for new or concerning symptoms.

## 2020-03-21 NOTE — ED Notes (Signed)
Ear irrigated with mixture of warm water and hydrogen peroxide; minimal results; provider to bedside for reassessment.  Pt tolerated well.

## 2020-03-21 NOTE — ED Triage Notes (Signed)
Pt reports R ear pain and unable to hear out of it since yesterday.

## 2020-03-21 NOTE — ED Provider Notes (Addendum)
[facility] EMERGENCY DEPARTMENT Provider Note   CSN: [id number] Arrival date & time: 03/21/20  0404     History Chief Complaint  Patient presents with  . Otalgia    Candace Liu is a 29 y.o. female.  The history is provided by the patient. No language interpreter was used.  Otalgia Location:  Right Behind ear:  No abnormality Severity:  Moderate Onset quality:  Gradual Duration:  2 days Timing:  Constant Progression:  Worsening Relieved by:  Nothing Ineffective treatments: topical hydrocortisone. Associated symptoms: hearing loss   Associated symptoms: no ear discharge and no fever       Past Medical History:  Diagnosis Date  . Headache   . No pertinent past medical history   . Obesity   . Pseudotumor cerebri     Patient Active Problem List   Diagnosis Date Noted  . Postpartum hemorrhage 10/01/2016  . Supervision of normal pregnancy, antepartum 05/11/2016  . Not immune to rubella 04/14/2016  . GBS (group B streptococcus) UTI complicating pregnancy 04/14/2016    Past Surgical History:  Procedure Laterality Date  . NO PAST SURGERIES       OB History    Gravida  3   Para  3   Term  3   Preterm  0   AB  0   Living  3     SAB  0   TAB  0   Ectopic  0   Multiple  0   Live Births  3           Family History  Problem Relation Age of Onset  . Heart disease Brother        MI  . Healthy Mother   . Healthy Father   . Migraines Neg Hx     Social History   Tobacco Use  . Smoking status: Never Smoker  . Smokeless tobacco: Never Used  Substance Use Topics  . Alcohol use: Yes    Comment: socially  . Drug use: No    Home Medications Prior to Admission medications   Medication Sig Start Date End Date Taking? Authorizing Provider  ciprofloxacin-hydrocortisone (CIPRO HC OTIC) OTIC suspension Place 3 drops into the right ear 2 (two) times daily. 03/21/20   Antony Madura, PA-C  ferrous sulfate 325 (65 FE) MG tablet  Take 1 tablet (325 mg total) by mouth 2 (two) times daily with a meal. 10/02/16   Dove, Myra C, MD  ibuprofen (ADVIL,MOTRIN) 600 MG tablet Take 1 tablet (600 mg total) by mouth every 6 (six) hours. 10/02/16   Allie Bossier, MD  Prenatal Vit-Fe Fumarate-FA (PRENATAL MULTIVITAMIN) TABS tablet Take 1 tablet by mouth daily at 12 noon.    [provider]    Allergies    Patient has no known allergies.  Review of Systems   Review of Systems  Constitutional: Negative for fever.  HENT: Positive for ear pain and hearing loss. Negative for ear discharge.   Ten systems reviewed and are negative for acute change, except as noted in the HPI.    Physical Exam Updated Vital Signs BP 110/81 (BP Location: Right Arm)   Pulse 92   Temp 98.9 F (37.2 C) (Oral)   Resp 16   SpO2 100%   Physical Exam Vitals and nursing note reviewed.  Constitutional:      General: She is not in acute distress.    Appearance: She is well-developed. She is not diaphoretic.  Comments: Nontoxic appearing and in NAD  HENT:     Head: Normocephalic and atraumatic.     Left Ear: Tympanic membrane, ear canal and external ear normal.     Ears:     Comments: Edematous right ear canal. Unable to visualize right TM due to impacted cerumen. External ear appears normal, but with TTP to tragus and when pulling on auricle. No mastoid swelling, erythema, tenderness. Eyes:     General: No scleral icterus.    Conjunctiva/sclera: Conjunctivae normal.  Neck:     Comments: No meningismus Pulmonary:     Effort: Pulmonary effort is normal. No respiratory distress.     Comments: Respirations even and unlabored. Musculoskeletal:        General: Normal range of motion.     Cervical back: Normal range of motion.  Skin:    General: Skin is warm and dry.     Coloration: Skin is not pale.     Findings: No erythema or rash.  Neurological:     Mental Status: She is alert and oriented to person, place, and time.  Psychiatric:          Behavior: Behavior normal.     ED Results / Procedures / Treatments   Labs (all labs ordered are listed, but only abnormal results are displayed) Labs Reviewed - No data to display  EKG None  Radiology No results found.  Procedures Procedures (including critical care time)   Medications Ordered in ED Medications  ibuprofen (ADVIL) tablet 800 mg (800 mg Oral Given 03/21/20 0607)    ED Course  I have reviewed the triage vital signs and the nursing notes.  Pertinent labs & imaging results that were available during my care of the patient were reviewed by me and considered in my medical decision making (see chart for details).  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  We will still proceed with ciprofloxacin drops given swelling of the canal and external ear tenderness.  Encouraged ENT follow-up as previously discussed.   [KH]    Clinical Course User Index [KH] Antony Madura, PA-C   MDM Rules/Calculators/A&P                           Patien presenting with ear pain x 2 days; exam c/w otitis externa. Patient afebrile in NAD. Exam not concerning for mastoiditis, cellulitis or malignant OE. Discharged with Rx for Ciprofloxacin otic.  Advised ENT follow up if no improvement with treatment or no complete resolution by 7 days. Return precautions discussed and provided. Patient discharged in stable condition with no unaddressed concerns.   Final Clinical Impression(s) / ED Diagnoses Final diagnoses:  Acute otitis externa of right ear, unspecified type  Excessive cerumen in ear canal, right    Rx / DC Orders ED Discharge Orders         Ordered    ciprofloxacin-hydrocortisone (CIPRO HC OTIC) OTIC suspension  2 times daily        03/21/20 0626           Antony Madura, PA-C 03/21/20 0630    Antony Madura, PA-C 03/21/20 3235    Maia Plan, MD 03/21/20 1949

## 2020-04-25 ENCOUNTER — Institutional Professional Consult (permissible substitution): Payer: Self-pay | Admitting: Neurology

## 2020-05-25 ENCOUNTER — Encounter: Payer: Self-pay | Admitting: Emergency Medicine

## 2020-05-25 ENCOUNTER — Other Ambulatory Visit: Payer: Self-pay

## 2020-05-25 ENCOUNTER — Ambulatory Visit
Admission: EM | Admit: 2020-05-25 | Discharge: 2020-05-25 | Disposition: A | Discharge status: Home / Self Care | Payer: BC Managed Care – PPO | Attending: Emergency Medicine | Admitting: Emergency Medicine

## 2020-05-25 DIAGNOSIS — L089 Local infection of the skin and subcutaneous tissue, unspecified: Secondary | ICD-10-CM | POA: Diagnosis not present

## 2020-05-25 DIAGNOSIS — L723 Sebaceous cyst: Secondary | ICD-10-CM | POA: Diagnosis not present

## 2020-05-25 MED ORDER — DOXYCYCLINE HYCLATE 100 MG PO CAPS: 100.0000 mg | ORAL_CAPSULE | Freq: Two times a day (BID) | ORAL | 0 refills | Status: AC

## 2020-05-25 MED ORDER — IBUPROFEN 800 MG PO TABS: 800.0000 mg | ORAL_TABLET | Freq: Three times a day (TID) | ORAL | 0 refills | Status: DC

## 2020-05-25 NOTE — Discharge Instructions (Addendum)
Please begin doxycycline for 10 days ° °Apply warm compresses/hot rags to area with massage ° °Return if symptoms returning or not improving °

## 2020-05-25 NOTE — ED Triage Notes (Signed)
Pt here for possible cyst to sacral area that has been present for a long time but recently become painful and swollen

## 2020-05-25 NOTE — ED Provider Notes (Signed)
[facility]    CSN: [id number] Arrival date & time: 05/25/20  1333      History   Chief Complaint Chief Complaint  Patient presents with  . Cyst    HPI Candace Liu is a 29 y.o. female presenting today for evaluation of a bump on back.  Reports that she has had a small bump to her left gluteal area for many years.  Over the past 2 to 3 days it has become more swollen red and painful.  Denies any drainage.  Denies any rectal pain.  Has felt fatigued, but denies fevers.  HPI  Past Medical History:  Diagnosis Date  . Headache   . No pertinent past medical history   . Obesity   . Pseudotumor cerebri     Patient Active Problem List   Diagnosis Date Noted  . Postpartum hemorrhage 10/01/2016  . Supervision of normal pregnancy, antepartum 05/11/2016  . Not immune to rubella 04/14/2016  . GBS (group B streptococcus) UTI complicating pregnancy 04/14/2016    Past Surgical History:  Procedure Laterality Date  . NO PAST SURGERIES      OB History    Gravida  3   Para  3   Term  3   Preterm  0   AB  0   Living  3     SAB  0   IAB  0   Ectopic  0   Multiple  0   Live Births  3            Home Medications    Prior to Admission medications   Medication Sig Start Date End Date Taking? Authorizing Provider  doxycycline (VIBRAMYCIN) 100 MG capsule Take 1 capsule (100 mg total) by mouth 2 (two) times daily for 10 days. 05/25/20 06/04/20  Burt Piatek C, PA-C  ferrous sulfate 325 (65 FE) MG tablet Take 1 tablet (325 mg total) by mouth 2 (two) times daily with a meal. 10/02/16   Dove, Myra C, MD  ibuprofen (ADVIL) 800 MG tablet Take 1 tablet (800 mg total) by mouth 3 (three) times daily. 05/25/20   Marget Outten C, PA-C  Prenatal Vit-Fe Fumarate-FA (PRENATAL MULTIVITAMIN) TABS tablet Take 1 tablet by mouth daily at 12 noon.    [provider]    Family History Family History  Problem Relation Age of Onset  . Heart disease Brother         MI  . Healthy Mother   . Healthy Father   . Migraines Neg Hx     Social History Social History   Tobacco Use  . Smoking status: Never Smoker  . Smokeless tobacco: Never Used  Substance Use Topics  . Alcohol use: Yes    Comment: socially  . Drug use: No     Allergies   Patient has no known allergies.   Review of Systems Review of Systems  Constitutional: Negative for fatigue and fever.  HENT: Negative for mouth sores.   Eyes: Negative for visual disturbance.  Respiratory: Negative for shortness of breath.   Cardiovascular: Negative for chest pain.  Gastrointestinal: Negative for abdominal pain, nausea and vomiting.  Genitourinary: Negative for genital sores.  Musculoskeletal: Negative for arthralgias and joint swelling.  Skin: Positive for color change. Negative for rash and wound.  Neurological: Negative for dizziness, weakness, light-headedness and headaches.     Physical Exam Triage Vital Signs ED Triage Vitals  Enc Vitals Group     BP  Pulse      Resp      Temp      Temp src      SpO2      Weight      Height      Head Circumference      Peak Flow      Pain Score      Pain Loc      Pain Edu?      Excl. in GC?    No data found.  Updated Vital Signs BP 138/88 (BP Location: Right Arm)   Pulse 87   Temp 98.9 F (37.2 C) (Oral)   Resp 18   SpO2 98%   Visual Acuity Right Eye Distance:   Left Eye Distance:   Bilateral Distance:    Right Eye Near:   Left Eye Near:    Bilateral Near:     Physical Exam Vitals and nursing note reviewed.  Constitutional:      Appearance: She is well-developed and well-nourished.     Comments: No acute distress  HENT:     Head: Normocephalic and atraumatic.     Nose: Nose normal.  Eyes:     Conjunctiva/sclera: Conjunctivae normal.  Cardiovascular:     Rate and Rhythm: Normal rate.  Pulmonary:     Effort: Pulmonary effort is normal. No respiratory distress.  Abdominal:     General: There is no  distension.  Musculoskeletal:        General: Normal range of motion.     Cervical back: Neck supple.  Skin:    General: Skin is warm and dry.     Comments: Left superior gluteal region with black central punctate mark suggestive of sebaceous cyst with surrounding erythema with mild induration, no fluctuance noted  Neurological:     Mental Status: She is alert and oriented to person, place, and time.  Psychiatric:        Mood and Affect: Mood and affect normal.      UC Treatments / Results  Labs (all labs ordered are listed, but only abnormal results are displayed) Labs Reviewed - No data to display  EKG   Radiology No results found.  Procedures Procedures (including critical care time)  Medications Ordered in UC Medications - No data to display  Initial Impression / Assessment and Plan / UC Course  I have reviewed the triage vital signs and the nursing notes.  Pertinent labs & imaging results that were available during my care of the patient were reviewed by me and considered in my medical decision making (see chart for details).     Suspect likely infected sebaceous cyst, no palpable fluctuant pocket at this time for I&D.  Placing on doxycycline recommend warm compresses and monitoring for gradual resolution.  Discussed symptoms may not fully resolve alone and if symptoms progressing or worsening to refollow-up in approximately 2 to 3 days.  Discussed strict return precautions. Patient verbalized understanding and is agreeable with plan.  Final Clinical Impressions(s) / UC Diagnoses   Final diagnoses:  Infected sebaceous cyst     Discharge Instructions     Please begin doxycycline for 10 days  Apply warm compresses/hot rags to area with massage  Return if symptoms returning or not improving    ED Prescriptions    Medication Sig Dispense Auth. Provider   doxycycline (VIBRAMYCIN) 100 MG capsule Take 1 capsule (100 mg total) by mouth 2 (two) times daily  for 10 days. 20 capsule Jazier Mcglamery, May C, PA-C  ibuprofen (ADVIL) 800 MG tablet Take 1 tablet (800 mg total) by mouth 3 (three) times daily. 21 tablet Dalasia Predmore, [place] C, PA-C     PDMP not reviewed this encounter.   Lew Dawes, PA-C 05/25/20 1511

## 2021-03-11 ENCOUNTER — Other Ambulatory Visit: Payer: Self-pay

## 2021-03-11 ENCOUNTER — Ambulatory Visit
Admission: EM | Admit: 2021-03-11 | Discharge: 2021-03-11 | Disposition: A | Discharge status: Home / Self Care | Payer: BC Managed Care – PPO | Attending: Urgent Care | Admitting: Urgent Care

## 2021-03-11 ENCOUNTER — Encounter: Payer: Self-pay | Admitting: Emergency Medicine

## 2021-03-11 DIAGNOSIS — R519 Headache, unspecified: Secondary | ICD-10-CM

## 2021-03-11 DIAGNOSIS — G932 Benign intracranial hypertension: Secondary | ICD-10-CM

## 2021-03-11 MED ORDER — EXCEDRIN MIGRAINE 250-250-65 MG PO TABS: 1.0000 | ORAL_TABLET | Freq: Four times a day (QID) | ORAL | 0 refills | Status: DC | PRN

## 2021-03-11 MED ORDER — NAPROXEN 375 MG PO TABS: 375.0000 mg | ORAL_TABLET | Freq: Two times a day (BID) | ORAL | 0 refills | Status: DC

## 2021-03-11 NOTE — ED Triage Notes (Signed)
Headache x 3 days, unaffected by tylenol, keeping her up at night, denies head injury

## 2021-03-11 NOTE — ED Provider Notes (Signed)
[facility]   MRN: [id number] DOB: Nov 08, 1990  Subjective:   Candace Liu is a 30 y.o. female presenting for 3-day history of acute onset persistent frontal headache.  Patient has been using Tylenol with very temporary relief of her symptoms.  Denies confusion, weakness, numbness or tingling, vision changes, runny or stuffy nose, sore throat, cough, nausea, vomiting, photophobia.  Patient has a history of pseudotumor cerebri.  States that no one has helped her manage this.  She does not have a neurologist.  No neck pain, neck stiffness, rashes.  No current facility-administered medications for this encounter.  Current Outpatient Medications:    ferrous sulfate 325 (65 FE) MG tablet, Take 1 tablet (325 mg total) by mouth 2 (two) times daily with a meal., Disp: 60 tablet, Rfl: 1   ibuprofen (ADVIL) 800 MG tablet, Take 1 tablet (800 mg total) by mouth 3 (three) times daily., Disp: 21 tablet, Rfl: 0   Prenatal Vit-Fe Fumarate-FA (PRENATAL MULTIVITAMIN) TABS tablet, Take 1 tablet by mouth daily at 12 noon., Disp: , Rfl:    No Known Allergies  Past Medical History:  Diagnosis Date   Headache    No pertinent past medical history    Obesity    Pseudotumor cerebri      Past Surgical History:  Procedure Laterality Date   NO PAST SURGERIES      Family History  Problem Relation Age of Onset   Heart disease Brother        MI   Healthy Mother    Healthy Father    Migraines Neg Hx     Social History   Tobacco Use   Smoking status: Never   Smokeless tobacco: Never  Substance Use Topics   Alcohol use: Yes    Comment: socially   Drug use: No    ROS   Objective:   Vitals: BP 103/67 (BP Location: Left Arm)   Pulse 76   Temp 98.2 F (36.8 C) (Oral)   Resp 16   SpO2 98%   Physical Exam Constitutional:      General: She is not in acute distress.    Appearance: Normal appearance. She is well-developed. She is obese. She is not ill-appearing, toxic-appearing  or diaphoretic.  HENT:     Head: Normocephalic and atraumatic.     Right Ear: Tympanic membrane and ear canal normal. No drainage or tenderness. No middle ear effusion. Tympanic membrane is not erythematous.     Left Ear: Tympanic membrane and ear canal normal. No drainage or tenderness.  No middle ear effusion. Tympanic membrane is not erythematous.     Nose: Nose normal. No congestion or rhinorrhea.     Mouth/Throat:     Mouth: Mucous membranes are moist. No oral lesions.     Pharynx: Oropharynx is clear. No pharyngeal swelling, oropharyngeal exudate, posterior oropharyngeal erythema or uvula swelling.     Tonsils: No tonsillar exudate or tonsillar abscesses.  Eyes:     General: No scleral icterus.    Extraocular Movements: Extraocular movements intact.     Right eye: Normal extraocular motion.     Left eye: Normal extraocular motion.     Conjunctiva/sclera: Conjunctivae normal.     Pupils: Pupils are equal, round, and reactive to light. Pupils are equal.  Neck:     Meningeal: Brudzinski's sign and Kernig's sign absent.  Cardiovascular:     Rate and Rhythm: Normal rate.  Pulmonary:     Effort: Pulmonary effort is normal.  Musculoskeletal:  Cervical back: Normal range of motion and neck supple. No rigidity.  Lymphadenopathy:     Cervical: No cervical adenopathy.  Skin:    General: Skin is warm and dry.  Neurological:     General: No focal deficit present.     Mental Status: She is alert and oriented to person, place, and time.     Cranial Nerves: No cranial nerve deficit, dysarthria or facial asymmetry.     Sensory: No sensory deficit.     Motor: No weakness, tremor, atrophy, abnormal muscle tone, seizure activity or pronator drift.     Coordination: Romberg sign negative. Coordination normal. Finger-Nose-Finger Test and Heel to [facility] Test normal. Rapid alternating movements normal.     Gait: Gait and tandem walk normal.     Deep Tendon Reflexes: Reflexes normal.   Psychiatric:        Mood and Affect: Mood normal. Mood is not anxious or depressed.        Speech: Speech normal.        Behavior: Behavior normal. Behavior is not agitated.    Assessment and Plan :   PDMP not reviewed this encounter.  1. Frontal headache   2. Pseudotumor cerebri     Patient has a completely normal neurologic exam and very stable vital signs.  Counseled that she likely needs further management with the headache clinic or neurologist especially in light of her history of pseudotumor cerebri.  Offered IM Toradol which she declined, will use Excedrin for migraines and naproxen as an outpatient. Counseled patient on potential for adverse effects with medications prescribed/recommended today, ER and return-to-clinic precautions discussed, patient verbalized understanding.    Wallis Bamberg, [place] 03/11/21 [phone]

## 2021-03-19 ENCOUNTER — Emergency Department (HOSPITAL_COMMUNITY)
Admission: EM | Admit: 2021-03-19 | Discharge: 2021-03-19 | Discharge status: Against Medical Advice | Payer: BC Managed Care – PPO

## 2021-03-19 ENCOUNTER — Other Ambulatory Visit: Payer: Self-pay

## 2021-03-19 ENCOUNTER — Encounter (HOSPITAL_BASED_OUTPATIENT_CLINIC_OR_DEPARTMENT_OTHER): Payer: Self-pay

## 2021-03-19 ENCOUNTER — Emergency Department (HOSPITAL_BASED_OUTPATIENT_CLINIC_OR_DEPARTMENT_OTHER)
Admission: EM | Admit: 2021-03-19 | Discharge: 2021-03-19 | Disposition: A | Discharge status: Home / Self Care | Payer: BC Managed Care – PPO | Attending: Emergency Medicine | Admitting: Emergency Medicine

## 2021-03-19 ENCOUNTER — Emergency Department (HOSPITAL_BASED_OUTPATIENT_CLINIC_OR_DEPARTMENT_OTHER): Payer: BC Managed Care – PPO

## 2021-03-19 DIAGNOSIS — G43109 Migraine with aura, not intractable, without status migrainosus: Secondary | ICD-10-CM

## 2021-03-19 DIAGNOSIS — R519 Headache, unspecified: Secondary | ICD-10-CM | POA: Diagnosis present

## 2021-03-19 LAB — PREGNANCY, URINE: Preg Test, Ur: NEGATIVE

## 2021-03-19 MED ORDER — DIPHENHYDRAMINE HCL 50 MG/ML IJ SOLN
25.0000 mg | Freq: Once | INTRAMUSCULAR | Status: AC
  Administered 2021-03-19: 25 mg via INTRAVENOUS
  Filled 2021-03-19: qty 1

## 2021-03-19 MED ORDER — MAGNESIUM SULFATE IN D5W 1-5 GM/100ML-% IV SOLN
1.0000 g | Freq: Once | INTRAVENOUS | Status: AC
  Administered 2021-03-19: 1 g via INTRAVENOUS
  Filled 2021-03-19: qty 100

## 2021-03-19 MED ORDER — SODIUM CHLORIDE 0.9 % IV BOLUS
1000.0000 mL | Freq: Once | INTRAVENOUS | Status: AC
  Administered 2021-03-19: 1000 mL via INTRAVENOUS

## 2021-03-19 MED ORDER — PROCHLORPERAZINE EDISYLATE 10 MG/2ML IJ SOLN
10.0000 mg | Freq: Once | INTRAMUSCULAR | Status: AC
  Administered 2021-03-19: 10 mg via INTRAVENOUS
  Filled 2021-03-19: qty 2

## 2021-03-19 MED ORDER — KETOROLAC TROMETHAMINE 30 MG/ML IJ SOLN
30.0000 mg | Freq: Once | INTRAMUSCULAR | Status: AC
  Administered 2021-03-19: 30 mg via INTRAVENOUS
  Filled 2021-03-19: qty 1

## 2021-03-19 NOTE — ED Notes (Signed)
Pt did not respond for triage.  

## 2021-03-19 NOTE — ED Notes (Signed)
Pt transported to CT ?

## 2021-03-19 NOTE — ED Notes (Signed)
Pt ambulatory with steady gait 

## 2021-03-19 NOTE — ED Provider Notes (Signed)
[facility] EMERGENCY DEPARTMENT Provider Note   CSN: [id number] Arrival date & time: 03/19/21  1317     History Chief Complaint  Patient presents with   Headache    Candace Liu is a 30 y.o. female with history of pseudotumor cerebri who presents with headache for approximately 2 weeks.  Patient states that she had gradual onset of headache about 2 weeks ago, was trying Tylenol.  Was seen at urgent care last week and normal neurological exam and was discharged with naproxen and follow-up with neurology on 10/17.  She states that initially the naproxen was working, stopped helping yesterday.  This morning she also endorses an episode where she had painless bilateral loss of vision and ringing in her right ear.  This episode lasted about 4 to 5 minutes and went away on its own.  Since then she has a persistent headache that she describes as pressure behind both of her eyes and is made worse with bright lights.  She denies fevers, chills, dizziness, numbness, tingling.  She states she was diagnosed with pseudotumor cerebri back in [place] in 2010, has not had neurology follow-up since about 2012 but has been doing well since then. Does not take medication for this.    Headache Associated symptoms: hearing loss and photophobia   Associated symptoms: no congestion, no cough, no dizziness, no fever, no nausea, no numbness, no vomiting and no weakness       Past Medical History:  Diagnosis Date   Headache    No pertinent past medical history    Obesity    Pseudotumor cerebri     Patient Active Problem List   Diagnosis Date Noted   Postpartum hemorrhage 10/01/2016   Supervision of normal pregnancy, antepartum 05/11/2016   Not immune to rubella 04/14/2016   GBS (group B streptococcus) UTI complicating pregnancy 04/14/2016    Past Surgical History:  Procedure Laterality Date   NO PAST SURGERIES       OB History     Gravida  3   Para  3   Term  3   Preterm  0    AB  0   Living  3      SAB  0   IAB  0   Ectopic  0   Multiple  0   Live Births  3           Family History  Problem Relation Age of Onset   Heart disease Brother        MI   Healthy Mother    Healthy Father    Migraines Neg Hx     Social History   Tobacco Use   Smoking status: Never   Smokeless tobacco: Never  Vaping Use   Vaping Use: Never used  Substance Use Topics   Alcohol use: Yes    Comment: socially   Drug use: No    Home Medications Prior to Admission medications   Medication Sig Start Date End Date Taking? Authorizing Provider  naproxen (NAPROSYN) 375 MG tablet Take 1 tablet (375 mg total) by mouth 2 (two) times daily with a meal. 03/11/21  Yes Wallis Bamberg, PA-C  OVER THE COUNTER MEDICATION [facility] acylated alpha-glycans   Yes [provider]  aspirin-acetaminophen-caffeine (EXCEDRIN MIGRAINE) 4070940374 MG tablet Take 1 tablet by mouth every 6 (six) hours as needed for headache. 03/11/21   Wallis Bamberg, PA-C  ferrous sulfate 325 (65 FE) MG tablet Take 1 tablet (325 mg total) by mouth  2 (two) times daily with a meal. 10/02/16   Dove, Myra C, MD  ibuprofen (ADVIL) 800 MG tablet Take 1 tablet (800 mg total) by mouth 3 (three) times daily. 05/25/20   Wieters, Hallie C, PA-C  Prenatal Vit-Fe Fumarate-FA (PRENATAL MULTIVITAMIN) TABS tablet Take 1 tablet by mouth daily at 12 noon.    [provider]    Allergies    Patient has no known allergies.  Review of Systems   Review of Systems  Constitutional:  Negative for chills and fever.  HENT:  Positive for hearing loss. Negative for congestion.   Eyes:  Positive for photophobia. Negative for visual disturbance.  Respiratory:  Negative for cough.   Gastrointestinal:  Negative for nausea and vomiting.  Neurological:  Positive for headaches. Negative for dizziness, speech difficulty, weakness and numbness.  All other systems reviewed and are negative.  Physical Exam Updated Vital  Signs BP 125/75 (BP Location: Right Arm)   Pulse 74   Temp 98.5 F (36.9 C) (Oral)   Resp 18   Ht 5\' 5"  (1.651 m)   Wt 108.9 kg   LMP 02/12/2021   SpO2 100%   BMI 39.94 kg/m   Physical Exam Vitals and nursing note reviewed.  Constitutional:      Appearance: Normal appearance.  HENT:     Head: Normocephalic and atraumatic.  Eyes:     Conjunctiva/sclera: Conjunctivae normal.  Cardiovascular:     Rate and Rhythm: Normal rate and regular rhythm.  Pulmonary:     Effort: Pulmonary effort is normal. No respiratory distress.     Breath sounds: Normal breath sounds.  Abdominal:     General: There is no distension.     Palpations: Abdomen is soft.     Tenderness: There is no abdominal tenderness.  Skin:    General: Skin is warm and dry.  Neurological:     General: No focal deficit present.     Mental Status: She is alert.     Comments: Neuro: Speech is clear, able to follow commands. CN III-XII intact grossly intact. PERRLA. EOMI. Sensation intact throughout. Str 5/5 all extremities.    ED Results / Procedures / Treatments   Labs (all labs ordered are listed, but only abnormal results are displayed) Labs Reviewed  PREGNANCY, URINE    EKG None  Radiology CT Head Wo Contrast  Result Date: 03/19/2021 CLINICAL DATA:  Visual loss, binocular headache, classic migraine, 2 weeks of headache, dizziness, visual changes and nausea EXAM: CT HEAD WITHOUT CONTRAST TECHNIQUE: Contiguous axial images were obtained from the base of the skull through the vertex without intravenous contrast. Sagittal and coronal MPR images reconstructed from axial data set. COMPARISON:  12/05/2009 FINDINGS: Brain: Normal ventricular morphology. No midline shift or mass effect. Normal appearance of brain parenchyma. No intracranial hemorrhage, mass lesion, or evidence of acute infarction. No extra-axial fluid collections. Vascular: No hyperdense vessels. Skull: Intact Sinuses/Orbits: Clear Other: N/A IMPRESSION:  Normal exam. Electronically Signed   By: 12/07/2009 M.D.   On: 03/19/2021 15:26    Procedures Procedures   Medications Ordered in ED Medications  sodium chloride 0.9 % bolus 1,000 mL (0 mLs Intravenous Stopped 03/19/21 1627)  prochlorperazine (COMPAZINE) injection 10 mg (10 mg Intravenous Given 03/19/21 1513)  diphenhydrAMINE (BENADRYL) injection 25 mg (25 mg Intravenous Given 03/19/21 1514)  ketorolac (TORADOL) 30 MG/ML injection 30 mg (30 mg Intravenous Given 03/19/21 1513)  magnesium sulfate IVPB 1 g 100 mL (0 g Intravenous Stopped 03/19/21 1538)  ED Course  I have reviewed the triage vital signs and the nursing notes.  Pertinent labs & imaging results that were available during my care of the patient were reviewed by me and considered in my medical decision making (see chart for details).    MDM Rules/Calculators/A&P                           Patient is 30 year old female with history of pseudotumor cerebri who presents with headache for 2 weeks.  Patient states that she presented to urgent care last week was discharged with naproxen and neurology follow-up on 10/17.  She states that the naproxen stopped working yesterday.  She had an episode this morning of bilateral painless vision loss and ringing in her right ear that lasted for about 4 to 5 minutes.  No history of migraines.  On exam patient is afebrile, and neurological exam is normal as above. Symptoms sound consistent with migraine with aura.  I believe that if she was having vision changes due to the pseudotumor cerebri they would be more progressive, not necessarily an acute episode. However, plan to obtain CT head due to headache with vision change. Patient is at low risk for stroke, due to age and no comorbidities. Will start migraine cocktail in the interim.  CT head without contrast is unremarkable.  Not considering a lumbar puncture at this time.  On reevaluation patient states that she was able to sleep and her headache  has resolved.  Likely symptoms are related to migraine.  Discussed importance of following up with neurology at scheduled appointment.  Otherwise patient is stable and not requiring admission or inpatient treatment for symptoms at this time.  Discussed switching prescribed naproxen to do a combination medicine like Excedrin.  Discussed reasons to return to ED. Patient agreeable to plan.  Final Clinical Impression(s) / ED Diagnoses Final diagnoses:  Migraine with aura and without status migrainosus, not intractable    Rx / DC Orders ED Discharge Orders     None        Kobyn Kray T, PA-C 03/19/21 1640    Long, Arlyss Repress, MD 03/24/21 [phone]

## 2021-03-19 NOTE — Discharge Instructions (Addendum)
You were evaluated emergency department today for a headache.  We did a CT scan of your head due to your history of pseudotumor cerebri, and this was normal.  We treated you with the common medications we treat migraines and I am reassured by the fact that you're feeling better!  I think it is incredibly important that you follow-up at your neurology appointment on 10/17, and explain to them the symptoms that you had today.    As we discussed, I think that you would benefit from taking a combination migraine medication like Excedrin.  This is an over the counter medication that is a combination of acetaminophen (Tylenol), aspirin, and caffeine which helps best if taken right when your headache starts.  Continue to monitor how you're doing and return to the ER for new or worsening symptoms such as headache, vision changes, or hearing changes. It has been a pleasure seeing and caring for you today and I hope you start feeling better soon!

## 2021-03-19 NOTE — ED Triage Notes (Signed)
Pt c/o HA x 2 weeks-seen at UC last week rx naproxen and to f/u with neuro-had neuro appt 10/17-LWBS Claude PTA-NAd-steady gait

## 2021-03-31 ENCOUNTER — Other Ambulatory Visit: Payer: Self-pay

## 2021-03-31 ENCOUNTER — Encounter: Payer: Self-pay | Admitting: Psychiatry

## 2021-03-31 ENCOUNTER — Ambulatory Visit (INDEPENDENT_AMBULATORY_CARE_PROVIDER_SITE_OTHER): Payer: BC Managed Care – PPO | Admitting: Psychiatry

## 2021-03-31 VITALS — BP 124/85 | HR 82 | Ht 65.0 in | Wt 236.0 lb

## 2021-03-31 DIAGNOSIS — G932 Benign intracranial hypertension: Secondary | ICD-10-CM | POA: Diagnosis not present

## 2021-03-31 DIAGNOSIS — G43109 Migraine with aura, not intractable, without status migrainosus: Secondary | ICD-10-CM

## 2021-03-31 MED ORDER — TOPIRAMATE 25 MG PO TABS: ORAL_TABLET | ORAL | 3 refills | Status: AC

## 2021-03-31 MED ORDER — METHYLPREDNISOLONE 4 MG PO TBPK: ORAL_TABLET | ORAL | 0 refills | Status: DC

## 2021-03-31 NOTE — Patient Instructions (Addendum)
Start Topamax 25 mg at bedtime. Take one pill for one week, then increase to two pills at bedtime for one week, then increase to 3 pills at bedtime for one week, then take 4 pills at bedtime Medrol package to break current headache Let me know if headaches continue to worsen or you experience changes in your vision  Eye symptoms- Consider Blutech or other amber color blue light blocker   Post concussion suggestions to follow when reading:  1. limit the amount of information on the page  2. use index cards or cardboard to cover the page or computer  3. for computers and digital devices: decrease the brightness, increase the font size and increase the contrast  4. read in 10 minute blocks and take 10-20 minutes of rest (no near focusing) try to increase to 3- 4 total blocks before increasing the time 5. use audio reinforcement for books or lectures 6. wear a brimmed hat if overhead lights are producing glare 7. Consider tints:  amber or blue, anti-reflective or blue light blocking,  Sunglasses when outside and [facility] or Blue glasses for indoor and night time.

## 2021-03-31 NOTE — Progress Notes (Signed)
Referring:  Ginette Otto, [facility] [address] [place],  [place] [address]  PCP: Ginette Otto, [facility]  Neurology was asked to evaluate Candace Liu, a 30 year old female for a chief complaint of headaches.  Our recommendations of care will be communicated by shared medical record.    CC:  headaches  HPI:  Medical co-morbidities: IIH  The patient presents for worsening headaches which began in September. Initially they were intermittent, then they worsened in frequency until they became constant at the end of September. Headache is described as a constant 6/10 with fluctuations in intensity up to 10/10 at its worst. She went to the ER earlier this month because she passed out from her headaches. Had a CTH which was unremarkable. Naproxen initially helped but now is ineffective. She has been taking Excedrin which has not been helping. States this headache is similar to a previous headache she had last year. MRI/MRV/MRA showed a partially empty sella and were otherwise unremarkable.  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 Headaches resolved within a couple of months on Diamox. She was evaluated in 2016 for the return of her headaches and was recommended to start Topamax. She does not remember taking this. Also noted to have a headache with dizziness and spots in her vision in 2017 which was suspected to be migraine  Headache History: Onset: Late September 2022 Triggers: salty food, standing Aura: spots in the vision lasting ~10 minutes Location: bifrontal Quality/Description: throbbing Severity: 6-10/10 Associated Symptoms:  Photophobia: yes  Phonophobia: yes  Nausea: yes Vomiting: yes Other symptoms: lightheadedness and syncope +pulsatile tinnitus Worse with activity?: yes Duration of headaches: constant   Headache days per month: 30 Headache free days per month: 0  Current  Treatment: Abortive Excedrin  Preventative none  Prior Therapies                                 Topamax 75 mg QHS diamox  Headache Risk Factors: Headache risk factors and/or co-morbidities (-) Neck Pain (-) Back Pain (-) History of Motor Vehicle Accident (-) Sleep Disorder (-) Fibromyalgia (+) Obesity  Body mass index is 39.27 kg/m. (-) History of Traumatic Brain Injury and/or Concussion (-) History of Syncope (-) TMJ Dysfunction/Bruxism  LABS: N/a  IMAGING:  MRI/MRA/MRV 06/18/19: partially empty sella, otherwise unremarkable  [facility] 03/19/21 personally reviewed and is unremarkable.  Current Outpatient Medications on File Prior to Visit  Medication Sig Dispense Refill   aspirin-acetaminophen-caffeine (EXCEDRIN MIGRAINE) 250-250-65 MG tablet Take 1 tablet by mouth every 6 (six) hours as needed for headache. 30 tablet 0   ibuprofen (ADVIL) 800 MG tablet Take 1 tablet (800 mg total) by mouth 3 (three) times daily. 21 tablet 0   naproxen (NAPROSYN) 375 MG tablet Take 1 tablet (375 mg total) by mouth 2 (two) times daily with a meal. 30 tablet 0   OVER THE COUNTER MEDICATION [facility] acylated alpha-glycans     No current facility-administered medications on file prior to visit.     Allergies: No Known Allergies  Family History: Migraine or other headaches in the family:  yes Aneurysms in a first degree relative:  no Brain tumors in the family:  no Other neurological illness in the family:   no  Past Medical History: Past Medical History:  Diagnosis Date   Headache    No pertinent past medical  history    Obesity    Pseudotumor cerebri     Past Surgical History Past Surgical History:  Procedure Laterality Date   NO PAST SURGERIES      Social History: Social History   Tobacco Use   Smoking status: Never   Smokeless tobacco: Never  Vaping Use   Vaping Use: Never used  Substance Use Topics   Alcohol use: Yes    Comment: socially   Drug use: No      ROS: Negative for fevers, chills. Positive for headaches. All other systems reviewed and negative unless stated otherwise in HPI.   Physical Exam:   Vital Signs: BP 124/85   Pulse 82   Ht 5\' 5"  (1.651 m)   Wt 236 lb (107 kg)   BMI 39.27 kg/m  GENERAL: well appearing,in no acute distress,alert SKIN:  Color, texture, turgor normal. No rashes or lesions HEAD:  Normocephalic/atraumatic. CV:  RRR RESP: Normal respiratory effort MSK: no tenderness to palpation over occiput, neck, or shoulders  NEUROLOGICAL: Mental Status: Alert, oriented to person, place and time,Follows commands Cranial Nerves: PERRL,optic discs sharp OU, visual fields intact to confrontation,extraocular movements intact,facial sensation intact,no facial droop or ptosis,hearing intact to finger rub bilaterally,no dysarthria,palate elevate symmetrically,tongue protrudes midline,shoulder shrug intact and symmetric Motor: muscle strength 5/5 both upper and lower extremities,no drift, normal tone Reflexes: 2+ throughout Sensation: intact to light touch all 4 extremities Coordination: Finger-to- nose-finger intact bilaterally,Heel-to-shin intact bilaterally Gait: normal-based   IMPRESSION: 30 year old female with a history of IIH who presents for evaluation of worsening headache. CTH and neurologic exam including fundus exam are unremarkable. Suspect she has a history of migraines as she has had multiple similar headaches which resolved without intervention. Will start Topamax for headache prevention as this can treat migraines and increased intracranial pressure. Discussed lumbar puncture as next step if headaches worsen or if she develops changes in her vision. Medrol dosepak provided to help break current headache cycle.  PLAN: -Start Topamax. Take 25 mg once daily; increase dose by 25 mg each week up to 100 mg/day -Medrol dosepak to break current headache -Next steps: LP if headaches worsen or she experiences  visual changes   I spent a total of 54 minutes chart reviewing and counseling the patient. Headache education was done. Discussed treatment options including preventive and acute medications, natural supplements, and physical therapy. Discussed medication overuse headache and to limit use of acute treatments to no more than 2 days/week or 10 days/month. Discussed medication side effects, adverse reactions and drug interactions. Written educational materials and patient instructions outlining all of the above were given.  Follow-up: 3 months, or sooner if headaches/vision worsen   32, MD 03/31/2021   3:52 PM

## 2021-05-22 ENCOUNTER — Encounter (HOSPITAL_BASED_OUTPATIENT_CLINIC_OR_DEPARTMENT_OTHER): Payer: Self-pay | Admitting: *Deleted

## 2021-05-22 ENCOUNTER — Emergency Department (HOSPITAL_BASED_OUTPATIENT_CLINIC_OR_DEPARTMENT_OTHER): Payer: BC Managed Care – PPO

## 2021-05-22 ENCOUNTER — Emergency Department (HOSPITAL_BASED_OUTPATIENT_CLINIC_OR_DEPARTMENT_OTHER)
Admission: EM | Admit: 2021-05-22 | Discharge: 2021-05-22 | Disposition: A | Discharge status: Home / Self Care | Payer: BC Managed Care – PPO | Attending: Emergency Medicine | Admitting: Emergency Medicine

## 2021-05-22 ENCOUNTER — Other Ambulatory Visit: Payer: Self-pay

## 2021-05-22 DIAGNOSIS — B029 Zoster without complications: Secondary | ICD-10-CM | POA: Diagnosis not present

## 2021-05-22 DIAGNOSIS — R21 Rash and other nonspecific skin eruption: Secondary | ICD-10-CM | POA: Diagnosis present

## 2021-05-22 DIAGNOSIS — Z7982 Long term (current) use of aspirin: Secondary | ICD-10-CM | POA: Insufficient documentation

## 2021-05-22 MED ORDER — VALACYCLOVIR HCL 500 MG PO TABS
1000.0000 mg | ORAL_TABLET | Freq: Once | ORAL | Status: AC
  Administered 2021-05-22: 1000 mg via ORAL
  Filled 2021-05-22: qty 2

## 2021-05-22 MED ORDER — IBUPROFEN 800 MG PO TABS
800.0000 mg | ORAL_TABLET | Freq: Once | ORAL | Status: AC
  Administered 2021-05-22: 800 mg via ORAL
  Filled 2021-05-22: qty 1

## 2021-05-22 MED ORDER — VALACYCLOVIR HCL 1 G PO TABS: 1000.0000 mg | ORAL_TABLET | Freq: Three times a day (TID) | ORAL | 0 refills | Status: DC

## 2021-05-22 MED ORDER — CEPHALEXIN 500 MG PO CAPS: 500.0000 mg | ORAL_CAPSULE | Freq: Four times a day (QID) | ORAL | 0 refills | Status: DC

## 2021-05-22 MED ORDER — OXYCODONE HCL 5 MG PO TABS: 5.0000 mg | ORAL_TABLET | ORAL | 0 refills | Status: DC | PRN

## 2021-05-22 NOTE — Discharge Instructions (Signed)
You were seen in the ER today for your rash.  You were diagnosed with shingles, also known as herpes zoster.  You have been prescribed an antiviral medication called Valtrex which you should take 3 times a day for the next week.  Additionally we have prescribed pain medication which take as needed for more severe pain.  You may alternate acetaminophen and ibuprofen every 3 hours as needed.    Additionally well I do not feel there is any concern for true bacterial infection of your skin and emergency department visit today you have been provided an antibiotic to begin taking at home if you have any worsening swelling or redness 72 hours from now.  Please follow-up with your primary care doctor and return to the ER with any new severe symptoms.  Avoid all contact with elderly people, they are young infants, anyone with weakened immune system such as patients with cancer, as they can all be quite susceptible to shingles infection.

## 2021-05-22 NOTE — ED Notes (Signed)
Patient transported to X-ray 

## 2021-05-22 NOTE — ED Triage Notes (Signed)
Insect bite to her left ankle x 2 days.

## 2021-05-22 NOTE — ED Notes (Signed)
Pt states redness on left heel noted Tuesday, today redness to heel is now worse, states numbness up left leg.  Small rash area noted to left heel.

## 2021-05-22 NOTE — ED Provider Notes (Signed)
[facility] EMERGENCY DEPARTMENT Provider Note   CSN: [id number] Arrival date & time: 05/22/21  1605     History Chief Complaint  Patient presents with   Insect Bite    Candace Liu is a 30 y.o. female who presents with concern for skin changes to her left ankle for 2 days, concerned she may have had an insect bite.  She states that for the last 3 days she had had some severe itching to this area followed by burning sensation prior to onset of rash which she describes as small blisters that have opened and are weeping clear fluid without any puslike drainage.  No history of known exposure to insects or animals.  She does endorse radiation of the pain up the back of the leg as a burning sensation.  I have personally reviewed this patient's medical records.  She is history of pseudotumor cerebri, obesity.  She is not anticoagulated.   HPI     Past Medical History:  Diagnosis Date   Headache    No pertinent past medical history    Obesity    Pseudotumor cerebri     Patient Active Problem List   Diagnosis Date Noted   Postpartum hemorrhage 10/01/2016   Supervision of normal pregnancy, antepartum 05/11/2016   Not immune to rubella 04/14/2016   GBS (group B streptococcus) UTI complicating pregnancy 04/14/2016    Past Surgical History:  Procedure Laterality Date   NO PAST SURGERIES       OB History     Gravida  3   Para  3   Term  3   Preterm  0   AB  0   Living  3      SAB  0   IAB  0   Ectopic  0   Multiple  0   Live Births  3           Family History  Problem Relation Age of Onset   Heart disease Brother        MI   Healthy Mother    Healthy Father    Migraines Neg Hx     Social History   Tobacco Use   Smoking status: Never   Smokeless tobacco: Never  Vaping Use   Vaping Use: Never used  Substance Use Topics   Alcohol use: Yes    Comment: socially   Drug use: No    Home Medications Prior to Admission medications    Medication Sig Start Date End Date Taking? Authorizing Provider  cephALEXin (KEFLEX) 500 MG capsule Take 1 capsule (500 mg total) by mouth 4 (four) times daily. 05/22/21  Yes Jimia Gentles R, PA-C  oxyCODONE (ROXICODONE) 5 MG immediate release tablet Take 1 tablet (5 mg total) by mouth every 4 (four) hours as needed for severe pain. 05/22/21  Yes Haywood Meinders, Eugene Gavia, PA-C  valACYclovir (VALTREX) 1000 MG tablet Take 1 tablet (1,000 mg total) by mouth 3 (three) times daily. 05/22/21  Yes Quenisha Lovins, Eugene Gavia, PA-C  aspirin-acetaminophen-caffeine (EXCEDRIN MIGRAINE) [phone] MG tablet Take 1 tablet by mouth every 6 (six) hours as needed for headache. 03/11/21   Wallis Bamberg, PA-C  ibuprofen (ADVIL) 800 MG tablet Take 1 tablet (800 mg total) by mouth 3 (three) times daily. 05/25/20   Wieters, Hallie C, PA-C  methylPREDNISolone (MEDROL DOSEPAK) 4 MG TBPK tablet Take as directed by packaging 03/31/21   Ocie Doyne, MD  naproxen (NAPROSYN) 375 MG tablet Take 1 tablet (375 mg total) by  mouth 2 (two) times daily with a meal. 03/11/21   Wallis Bamberg, PA-C  OVER THE COUNTER MEDICATION [facility] acylated alpha-glycans    [provider]  topiramate (TOPAMAX) 25 MG tablet Take 25 mg (1 pill) at bedtime for one week, then increase to 50 mg (2 pills) at bedtime, then increase to 75 mg (3 pills) at bedtime for one week, then increase to 100 mg (4 pills) at bedtime 03/31/21   Ocie Doyne, MD    Allergies    Patient has no known allergies.  Review of Systems   Review of Systems  Constitutional: Negative.   HENT: Negative.    Respiratory: Negative.    Cardiovascular: Negative.   Gastrointestinal: Negative.   Genitourinary: Negative.   Musculoskeletal:  Positive for myalgias.  Skin:  Positive for rash.  Neurological: Negative.  Negative for numbness.   Physical Exam Updated Vital Signs BP 115/87 (BP Location: Right Arm)   Pulse 90   Temp 98 F (36.7 C) (Oral)   Resp 18   Ht 5' 3.5"  (1.613 m)   Wt 104.3 kg   LMP 04/22/2021   SpO2 100%   BMI 40.10 kg/m   Physical Exam Vitals and nursing note reviewed.  Constitutional:      Appearance: She is not ill-appearing or toxic-appearing.  HENT:     Head: Normocephalic and atraumatic.     Nose: Nose normal. No congestion.     Mouth/Throat:     Mouth: Mucous membranes are moist.     Pharynx: No oropharyngeal exudate or posterior oropharyngeal erythema.  Eyes:     General: Lids are normal. Vision grossly intact.        Right eye: No discharge.        Left eye: No discharge.     Extraocular Movements: Extraocular movements intact.     Conjunctiva/sclera: Conjunctivae normal.     Pupils: Pupils are equal, round, and reactive to light.  Cardiovascular:     Rate and Rhythm: Normal rate and regular rhythm.     Pulses: Normal pulses.          Dorsalis pedis pulses are 2+ on the right side and 2+ on the left side.     Heart sounds: Normal heart sounds. No murmur heard. Pulmonary:     Effort: Pulmonary effort is normal. No respiratory distress.     Breath sounds: Normal breath sounds. No wheezing or rales.  Abdominal:     General: Bowel sounds are normal. There is no distension.     Palpations: Abdomen is soft.     Tenderness: There is no abdominal tenderness. There is no guarding or rebound.  Musculoskeletal:        General: No deformity.     Cervical back: Normal range of motion and neck supple. No tenderness.     Right lower leg: No edema.     Left lower leg: No edema.       Legs:  Lymphadenopathy:     Cervical: No cervical adenopathy.  Skin:    General: Skin is warm and dry.     Capillary Refill: Capillary refill takes less than 2 seconds.  Neurological:     General: No focal deficit present.     Mental Status: She is alert and oriented to person, place, and time. Mental status is at baseline.     Gait: Gait is intact. Gait normal.  Psychiatric:        Mood and Affect: Mood normal.  ED Results /  Procedures / Treatments   Labs (all labs ordered are listed, but only abnormal results are displayed) Labs Reviewed - No data to display  EKG None  Radiology DG Ankle 2 Views Left  Result Date: 05/22/2021 CLINICAL DATA:  Lateral ankle cellulitis, pain EXAM: LEFT ANKLE - 2 VIEW COMPARISON:  None. FINDINGS: There is no evidence of fracture, dislocation, or joint effusion. There is no evidence of arthropathy or other focal bone abnormality. No radiodense foreign body. No subcutaneous gas. Swelling lateral to the ankle and talus. IMPRESSION: Lateral soft tissue swelling.  No bone abnormality or foreign body. Electronically Signed   By: Corlis Leak M.D.   On: 05/22/2021 17:39    Procedures Procedures   Medications Ordered in ED Medications  valACYclovir (VALTREX) tablet 1,000 mg (1,000 mg Oral Given 05/22/21 1732)  ibuprofen (ADVIL) tablet 800 mg (800 mg Oral Given 05/22/21 1732)    ED Course  I have reviewed the triage vital signs and the nursing notes.  Pertinent labs & imaging results that were available during my care of the patient were reviewed by me and considered in my medical decision making (see chart for details).    MDM Rules/Calculators/A&P                         30 year old female presents with concern for skin changes and pain to the left foot for few days.  Differential diagnosis includes was not limited to herpes zoster, cellulitis, insect bite, varicella, monkey box, peripheral rash pemphigoid, contact dermatitis.  Vital signs are normal intake.  Cardiopulmonary exam is normal, abdominal exam is benign.  Patient does have vesicular herpetic appearing rash on the left posterior foot with pain rating up in the S1 dermatome distribution.    Plain film was obtained to evaluate given soft tissue swelling without any subcutaneous gas or foreign body.  Doubt cellulitis, favor diagnosis of herpes zoster given burning type pain that radiates without other cellulitic skin changes  as well as vesicular rash.  First dose of Valtrex and ibuprofen administered in the ED.  Will discharge with Valtrex.  Patient adamant that she should get prescribed antibiotics with concern for possible skin infection due to the swelling around the vesicular rash in her right foot.  There is a moderate amount of soft tissue edema that is warm to the touch without notable erythema; will prescribe antibiotics for the patient to begin in the next few days if the redness begins to streak down her foot or upper leg or she develops any purulent drainage from the area.  Strict outpatient follow-up is recommended.  No further work-up or to the ER at this time.  Candace Liu voiced understanding of her medical evaluation and treatment plan.  Her questions was answered to her expressed satisfaction.  Return precautions were given.  Patient is stable and appropriate for discharge at this time.  This chart was dictated using voice recognition software, Dragon. Despite the best efforts of this provider to proofread and correct errors, errors may still occur which can change documentation meannng.  Final Clinical Impression(s) / ED Diagnoses Final diagnoses:  Herpes zoster without complication    Rx / DC Orders ED Discharge Orders          Ordered    valACYclovir (VALTREX) 1000 MG tablet  3 times daily        05/22/21 1900    cephALEXin (KEFLEX) 500 MG capsule  4 times daily  05/22/21 1900    oxyCODONE (ROXICODONE) 5 MG immediate release tablet  Every 4 hours PRN        05/22/21 1900             Oriah Leinweber, Idelia Salm 05/22/21 2004    Rolan Bucco, MD 05/22/21 2311

## 2021-11-05 ENCOUNTER — Ambulatory Visit (INDEPENDENT_AMBULATORY_CARE_PROVIDER_SITE_OTHER): Payer: BC Managed Care – PPO | Admitting: Neurology

## 2021-11-05 ENCOUNTER — Encounter: Payer: Self-pay | Admitting: Neurology

## 2021-11-05 VITALS — BP 103/71 | HR 78 | Ht 64.0 in | Wt 234.0 lb

## 2021-11-05 DIAGNOSIS — R0683 Snoring: Secondary | ICD-10-CM

## 2021-11-05 DIAGNOSIS — G43109 Migraine with aura, not intractable, without status migrainosus: Secondary | ICD-10-CM

## 2021-11-05 DIAGNOSIS — R519 Headache, unspecified: Secondary | ICD-10-CM | POA: Diagnosis not present

## 2021-11-05 DIAGNOSIS — R0689 Other abnormalities of breathing: Secondary | ICD-10-CM

## 2021-11-05 DIAGNOSIS — G4719 Other hypersomnia: Secondary | ICD-10-CM

## 2021-11-05 DIAGNOSIS — G932 Benign intracranial hypertension: Secondary | ICD-10-CM

## 2021-11-05 NOTE — Patient Instructions (Addendum)
Thank you for choosing [facility] for your sleep related care! It was nice to meet you today!   Here is what we discussed today:    Please make sure, you have a follow up with Dr. Delena Bali for management of your headaches and pseudotumor cerebri.   Based on your symptoms and your exam I believe you are at risk for obstructive sleep apnea (aka OSA). We should proceed with a sleep study to determine whether you do or do not have OSA and how severe it is. Even, if you have mild OSA, I may want you to consider treatment with CPAP, as treatment of even borderline or mild sleep apnea can result and improvement of symptoms such as sleep disruption, daytime sleepiness, nighttime bathroom breaks, restless leg symptoms, improvement of headache syndromes, even improved mood disorder.   As explained, an attended sleep study (meaning you get to stay overnight in the sleep lab), lets [place] monitor sleep-related behaviors such as sleep talking and leg movements in sleep, in addition to monitoring for sleep apnea.  A home sleep test is a screening tool for sleep apnea diagnosis only, but unfortunately, does not help with any other sleep-related diagnoses.  Please remember, the long-term risks and ramifications of untreated moderate to severe obstructive sleep apnea may include (but are not limited to): increased risk for cardiovascular disease, including congestive heart failure, stroke, difficult to control hypertension, treatment resistant obesity, arrhythmias, especially irregular heartbeat commonly known as A. Fib. (atrial fibrillation); even type 2 diabetes has been linked to untreated OSA.   Other correlations that untreated obstructive sleep apnea include macular edema which is swelling of the retina in the eyes, droopy eyelid syndrome, and elevated hemoglobin and hematocrit levels (often referred to as polycythemia).  Sleep apnea can cause disruption of sleep and sleep deprivation in most cases,  which, in turn, can cause recurrent headaches, problems with memory, mood, concentration, focus, and vigilance. Most people with untreated sleep apnea report excessive daytime sleepiness, which can affect their ability to drive. Please do not drive or use heavy equipment or machinery, if you feel sleepy! Patients with sleep apnea can also develop difficulty initiating and maintaining sleep (aka insomnia).   Having sleep apnea may increase your risk for other sleep disorders, including involuntary behaviors sleep such as sleep terrors, sleep talking, sleepwalking.    Having sleep apnea can also increase your risk for restless leg syndrome and leg movements at night.   Please note that untreated obstructive sleep apnea may carry additional perioperative morbidity. Patients with significant obstructive sleep apnea (typically, in the moderate to severe degree) should receive, if possible, perioperative PAP (positive airway pressure) therapy and the surgeons and particularly the anesthesiologists should be informed of the diagnosis and the severity of the sleep disordered breathing.   We will call you or email you through MyChart with regards to your test results and plan a follow-up in sleep clinic accordingly. Most likely, you will hear from one of our nurses.   Our sleep lab administrative assistant will call you to schedule your sleep study and give you further instructions, regarding the check in process for the sleep study, arrival time, what to bring, when you can expect to leave after the study, etc., and to answer any other logistical questions you may have. If you don't hear back from her by about 2 weeks from now, please feel free to call her direct line at [phone] or you can call our general clinic number, or email  [place] through My Chart.

## 2021-11-05 NOTE — Progress Notes (Signed)
Subjective:    Patient ID: Candace Liu is a 31 y.o. female.  HPI    [name], MD, PhD [facility] [address], [address] [address] [place], [place] [address]  Dear Candace ManisElizabeth,  I saw your patient, Candace Framebony Arnall, upon your kind request in my sleep clinic today for initial consultation of Candace Liu sleep disorder, in particular, concern for underlying obstructive sleep apnea.  The patient is unaccompanied today.  As you know, Ms. Candace Liu is a 31 year old right-handed woman with an underlying medical history of pseudotumor cerebri and recurrent headaches (previously saw my former colleague Dr. Anne HahnWillis and more recently my colleague Dr. Delena Balihima), diabetes, chronic knee pain, and severe obesity with a BMI of over 40, who reports snoring and excessive daytime somnolence as well as morning headaches.  I reviewed your visit note from 07/16/2021.  Candace Liu Epworth sleepiness score is 21 out of 24, fatigue severity score is 44 out of 63.  She has had morning headaches frequently.  She reports taking the Topamax as needed.  She has not made a follow-up appointment with Dr. Delena Balihima but is encouraged to do so.  She goes to bed generally around 9:30 PM and rise time is around 5:45 AM.  She works for [employer], currently works from home.  She lives with Candace Liu 3 children, ages 445, 5910 and 815.  They have a pet rabbit in the household.  She has no daily caffeine.  She drinks alcohol less than twice a week.  She is a non-smoker.  She has 3 cousins who have sleep apnea and she suspects that Candace Liu father may have sleep apnea.  She does not have night to night nocturia.  She has a TV in Candace Liu bedroom but does not have it on at night.  She has occasionally woken up with a sense of gasping for air.  She is working on weight loss.  Candace Liu Past Medical History Is Significant For: Past Medical History:  Diagnosis Date   Headache    No pertinent past medical history    Obesity    Pseudotumor cerebri     Candace Liu Past  Surgical History Is Significant For: Past Surgical History:  Procedure Laterality Date   NO PAST SURGERIES      Candace Liu Family History Is Significant For: Family History  Problem Relation Age of Onset   Healthy Mother    Healthy Father    Heart disease Brother        MI   Sleep apnea Cousin    Migraines Neg Hx     Candace Liu Social History Is Significant For: Social History   Socioeconomic History   Marital status: Single    Spouse name: Not on file   Number of children: 2   Years of education: college   Highest education level: Not on file  Occupational History   Not on file  Tobacco Use   Smoking status: Never   Smokeless tobacco: Never  Vaping Use   Vaping Use: Never used  Substance and Sexual Activity   Alcohol use: Yes    Comment: socially   Drug use: No   Sexual activity: Yes    Birth control/protection: None  Other Topics Concern   Not on file  Social History Narrative   Patient does not drink caffeine but take green tea vitamins.   Patient is right handed.   Social Determinants of Health   Financial Resource Strain: Not on file  Food Insecurity: Not on file  Transportation Needs: Not on file  Physical Activity: Not on file  Stress: Not on file  Social Connections: Not on file    Candace Liu Allergies Are:  No Known Allergies:   Candace Liu Current Medications Are:  Outpatient Encounter Medications as of 11/05/2021  Medication Sig   aspirin-acetaminophen-caffeine (EXCEDRIN MIGRAINE) 250-250-65 MG tablet Take 1 tablet by mouth every 6 (six) hours as needed for headache.   cephALEXin (KEFLEX) 500 MG capsule Take 1 capsule (500 mg total) by mouth 4 (four) times daily.   ibuprofen (ADVIL) 800 MG tablet Take 1 tablet (800 mg total) by mouth 3 (three) times daily.   methylPREDNISolone (MEDROL DOSEPAK) 4 MG TBPK tablet Take as directed by packaging   naproxen (NAPROSYN) 375 MG tablet Take 1 tablet (375 mg total) by mouth 2 (two) times daily with a meal.   OVER THE COUNTER  MEDICATION [facility] acylated alpha-glycans   oxyCODONE (ROXICODONE) 5 MG immediate release tablet Take 1 tablet (5 mg total) by mouth every 4 (four) hours as needed for severe pain.   topiramate (TOPAMAX) 25 MG tablet Take 25 mg (1 pill) at bedtime for one week, then increase to 50 mg (2 pills) at bedtime, then increase to 75 mg (3 pills) at bedtime for one week, then increase to 100 mg (4 pills) at bedtime (Patient not taking: Reported on 11/05/2021)   valACYclovir (VALTREX) 1000 MG tablet Take 1 tablet (1,000 mg total) by mouth 3 (three) times daily.   No facility-administered encounter medications on file as of 11/05/2021.  :   Review of Systems:  Out of a complete 14 point review of systems, all are reviewed and negative with the exception of these symptoms as listed below:  Review of Systems  Neurological:        Pt is here for sleep consult pt states snores,fatigue,headaches ,. Pt denies hypertension ,sleep study, CPAP machine    ESS:21 FSS:44   Objective:  Neurological Exam  Physical Exam Physical Examination:   Vitals:   11/05/21 0859  BP: 103/71  Pulse: 78    General Examination: The patient is a very pleasant 31 y.o. female in no acute distress. She appears well-developed and well-nourished and well groomed.   HEENT: Normocephalic, atraumatic, pupils are equal, round and reactive to light, extraocular tracking is good without limitation to gaze excursion or nystagmus noted. Hearing is grossly intact. Face is symmetric with normal facial animation. Speech is clear with no dysarthria noted. There is no hypophonia. There is no lip, neck/head, jaw or voice tremor. Neck is supple with full range of passive and active motion. There are no carotid bruits on auscultation. Oropharynx exam reveals: mild mouth dryness, good dental hygiene and moderate airway crowding, due to smaller airway entry, tonsils of 1+ on the R and 1-2+ on the L, Mallampati class II. Tongue protrudes centrally and  palate elevates symmetrically. Neck size is 14.25 inches. She has a mild overbite.    Chest: Clear to auscultation without wheezing, rhonchi or crackles noted.  Heart: S1+S2+0, regular and normal without murmurs, rubs or gallops noted.   Abdomen: Soft, non-tender and non-distended.  Extremities: There is no pitting edema in the distal lower extremities bilaterally.   Skin: Warm and dry without trophic changes noted.   Musculoskeletal: exam reveals no obvious joint deformities, tenderness or joint swelling or erythema.   Neurologically:  Mental status: The patient is awake, alert and oriented in all 4 spheres. Candace Liu immediate and remote memory, attention, language skills and fund of  knowledge are appropriate. There is no evidence of aphasia, agnosia, apraxia or anomia. Speech is clear with normal prosody and enunciation. Thought process is linear. Mood is normal and affect is normal.  Cranial nerves II - XII are as described above under HEENT exam.  Motor exam: Normal bulk, strength and tone is noted. There is no obvious tremor. Fine motor skills and coordination: grossly intact.  Cerebellar testing: No dysmetria or intention tremor. There is no truncal or gait ataxia.  Sensory exam: intact to light touch in the upper and lower extremities.  Gait, station and balance: She stands easily. No veering to one side is noted. No leaning to one side is noted. Posture is age-appropriate and stance is narrow based. Gait shows normal stride length and normal pace. No problems turning are noted.   Assessment and Plan:  In summary, Floella Ensz is a very pleasant 31 y.o.-year old female with an underlying medical history of pseudotumor cerebri and recurrent headaches (previously saw my former colleague Dr. Anne Hahn and more recently my colleague Dr. Delena Bali), but diabetes, chronic knee pain, and severe obesity with a BMI of over 40, whose history and physical exam are concerning for obstructive sleep apnea  (OSA). I had a long chat with the patient about my findings and the diagnosis of OSA, its prognosis and treatment options. We talked about medical treatments, surgical interventions and non-pharmacological approaches. I explained in particular the risks and ramifications of untreated moderate to severe OSA, especially with respect to developing cardiovascular disease down the Road, including congestive heart failure, difficult to treat hypertension, cardiac arrhythmias, or stroke. Even type 2 diabetes has, in part, been linked to untreated OSA. Symptoms of untreated OSA include daytime sleepiness, memory problems, mood irritability and mood disorder such as depression and anxiety, lack of energy, as well as recurrent headaches, especially morning headaches. We talked about trying to maintain a healthy lifestyle in general, as well as the importance of weight control. We also talked about the importance of good sleep hygiene. I recommended the following at this time: sleep study.  I outlined the differences between a laboratory attended sleep study versus home sleep testing. I explained the sleep test procedure to the patient and also outlined possible surgical and non-surgical treatment options of OSA, including the use of a custom-made dental device (which would require a referral to a specialist dentist or oral surgeon), upper airway surgical options, such as traditional UPPP or a novel less invasive surgical option in the form of Inspire hypoglossal nerve stimulation (which would involve a referral to an ENT surgeon). I also explained the CPAP treatment option to the patient, who indicated that she would be willing to try PAP therapy, if the need arises.  We will pick up our discussion after sleep testing.  We will keep Candace Liu posted as to Candace Liu test results by phone call and plan a follow-up in sleep clinic accordingly.  She is encouraged to make a follow-up appointment with Dr. Delena Bali as well. I answered all Candace Liu  questions today and she was in agreement.   Thank you very much for allowing me to participate in the care of this nice patient. If I can be of any further assistance to you please do not hesitate to call me at [phone].  Sincerely,   Huston Foley, MD, PhD

## 2021-11-12 ENCOUNTER — Telehealth: Payer: Self-pay | Admitting: Neurology

## 2021-11-12 NOTE — Telephone Encounter (Signed)
[employer]- [employer] no auth req spoke to [place] Ref # [id number]. Patient is scheduled at [facility] For 12/07/21 at 9 pm.

## 2021-12-07 ENCOUNTER — Ambulatory Visit (INDEPENDENT_AMBULATORY_CARE_PROVIDER_SITE_OTHER): Payer: BC Managed Care – PPO | Admitting: Neurology

## 2021-12-07 DIAGNOSIS — G4730 Sleep apnea, unspecified: Secondary | ICD-10-CM

## 2021-12-07 DIAGNOSIS — G932 Benign intracranial hypertension: Secondary | ICD-10-CM

## 2021-12-07 DIAGNOSIS — R519 Headache, unspecified: Secondary | ICD-10-CM

## 2021-12-07 DIAGNOSIS — G43109 Migraine with aura, not intractable, without status migrainosus: Secondary | ICD-10-CM

## 2021-12-07 DIAGNOSIS — R0683 Snoring: Secondary | ICD-10-CM | POA: Diagnosis not present

## 2021-12-07 DIAGNOSIS — G4719 Other hypersomnia: Secondary | ICD-10-CM

## 2021-12-07 DIAGNOSIS — R0689 Other abnormalities of breathing: Secondary | ICD-10-CM

## 2021-12-30 NOTE — Procedures (Signed)
PATIENT'S NAME:  Candace Liu, Poster DOB:      Sep 25, 1990      MR#:    [id number]     DATE OF RECORDING: 12/07/2021 REFERRING M.D.:  Hulen Shouts, PA-C Study Performed:   Baseline Polysomnogram HISTORY: 31 year old right-handed woman with an underlying medical history of pseudotumor cerebri and recurrent headaches, diabetes, chronic knee pain, and severe obesity, who reports snoring and excessive daytime somnolence as well as morning headaches. The patient endorsed the Epworth Sleepiness Scale at 21 points. The patient's weight 234 pounds with a height of 64 (inches), resulting in a BMI of 39.9 kg/m2. The patient's neck circumference measured 14.2 inches.  CURRENT MEDICATIONS: Excedrin Migraine, Keflex, Advil, Medrol Dosepak, Naprosyn, oxycodone, Topamax, Valtrex   PROCEDURE:  This is a multichannel digital polysomnogram utilizing the Somnostar 11.2 system.  Electrodes and sensors were applied and monitored per AASM Specifications.   EEG, EOG, Chin and Limb EMG, were sampled at 200 Hz.  ECG, Snore and Nasal Pressure, Thermal Airflow, Respiratory Effort, CPAP Flow and Pressure, Oximetry was sampled at 50 Hz. Digital video and audio were recorded.      BASELINE STUDY  Lights Out was at 00:03 and Lights On at 06:50.  Total recording time (TRT) was 407.5 minutes, with a total sleep time (TST) of 324 minutes.   The patient's sleep latency was 57 minutes.  REM latency was 42.5 minutes, which is mildly reduced. The sleep efficiency was 79.5 %.     SLEEP ARCHITECTURE: WASO (Wake after sleep onset) was 26.5 minutes with minimal to mild sleep fragmentation noted.  There were 10 minutes in Stage N1, 165 minutes Stage N2, 82 minutes Stage N3 and 67 minutes in Stage REM.  The percentage of Stage N1 was 2.9%, which is normal, Stage N2 was 47.1%, which is normal, Stage N3 was 23.4% and Stage R (REM sleep) was 19.1%, which is near-normal. The arousals were noted as: 32 were spontaneous, 0 were associated with PLMs, 4 were  associated with respiratory events.  RESPIRATORY ANALYSIS:  There were a total of 17 respiratory events:  0 obstructive apneas, 0 central apneas and 0 mixed apneas with a total of 0 apneas and an apnea index (AI) of 0 /hour. There were 17 hypopneas with a hypopnea index of 3.1/hour. The patient also had 4 respiratory event related arousals (RERAs).      The total APNEA/HYPOPNEA INDEX (AHI) was 3.1/hour and the total RESPIRATORY DISTURBANCE INDEX was  3.1 /hour.  17 events occurred in REM sleep and 0 events in NREM. The REM AHI was  15.2 /hour, versus a non-REM AHI of 0. The patient spent 111 minutes of total sleep time in the supine position and 213 minutes in non-supine.. The supine AHI was 0.5 versus a non-supine AHI of 4.5.  OXYGEN SATURATION & C02:  The Wake baseline 02 saturation was 96%, with the lowest being 77%. Time spent below 89% saturation equaled 17 minutes.    PERIODIC LIMB MOVEMENTS:   The patient had a total of 10 Periodic Limb Movements.  The Periodic Limb Movement (PLM) index was 1.9 and the PLM Arousal index was 0/hour.  Audio and video analysis did not show any abnormal or unusual movements, behaviors, phonations or vocalizations. The patient took no bathroom breaks. Mild snoring was noted. The EKG was in keeping with normal sinus rhythm (NSR).  Post-study, the patient indicated that sleep was the same as usual.   IMPRESSION:  Primary Snoring Sleep disordered breathing  RECOMMENDATIONS:  This study  does not demonstrate any significant obstructive or central sleep apnea, with a total AHI of less than 5/hour. Mild snoring was noted. There is evidence of sleep disordered breathing during REM sleep. Treatment with a positive airway pressure device such as CPAP or autoPAP is not indicated. Weight loss and avoidance of the supine sleep position may aid in reducing her snoring and stage-related sleep apnea; she did not have supine REM sleep during this test.  The patient should  be cautioned not to drive, work at heights, or operate dangerous or heavy equipment when tired or sleepy. Review and reiteration of good sleep hygiene measures should be pursued with any patient. The patient will be advised to follow up with the referring provider, who will be notified of the test results.  I certify that I have reviewed the entire raw data recording prior to the issuance of this report in accordance with the Standards of Accreditation of the American Academy of Sleep Medicine (AASM)   Huston Foley, MD, PhD Diplomat, American Board of Neurology and Sleep Medicine (Neurology and Sleep Medicine)

## 2022-01-01 ENCOUNTER — Telehealth: Payer: Self-pay | Admitting: Neurology

## 2022-01-01 NOTE — Telephone Encounter (Signed)
-----   Message from Huston Foley, MD sent at 12/30/2021  5:51 PM EDT ----- Patient referred by Candace Shouts, PA, seen by me on 11/05/21, diagnostic PSG on 12/07/21.   Please call and notify the patient that the recent sleep study did not show any significant obstructive sleep apnea, with a total AHI of less than 5/hour. Mild snoring was noted. There was evidence of sleep apnea, when she is in dream/REM sleep. Treatment with a positive airway pressure device such as CPAP or autoPAP is not indicated. Weight loss and avoidance of the supine sleep position may aid in reducing her snoring and REM-related sleep apnea; at this juncture, she can followup with her PCP and Dr. Delena Bali as scheduled.   Thanks,  Huston Foley, MD, PhD [facility])

## 2022-01-01 NOTE — Telephone Encounter (Signed)
Called the patient and reviewed in detail her sleep study results. Advised there was no concern for sleep apnea present. Advised that she should avoid sleeping on her back and work on weight loss to help keep things where they are. Informed her there is no need to follow up in sleep clinic. Pt verbalized understanding.Pt had no questions at this time but was encouraged to call back if questions arise.

## 2022-01-13 ENCOUNTER — Ambulatory Visit: Payer: BC Managed Care – PPO | Admitting: Psychiatry

## 2022-01-23 IMAGING — CT CT HEAD W/O CM
3 series · 16 of 47 positions shown, 19 images · non-contrast
Comparison: 12/05/2009

CLINICAL DATA: Visual loss, binocular headache, classic migraine, 2
weeks of headache, dizziness, visual changes and nausea

EXAM:
CT HEAD WITHOUT CONTRAST
TECHNIQUE: Contiguous axial images were obtained from the base of the skull
through the vertex without intravenous contrast. Sagittal and
coronal MPR images reconstructed from axial data set.

[Series 2: head wo · axial · 0.41mm/px · z∈[-137,-12]mm · 10 of 30 slices shown, 13 images]
[im 3/30  brain]
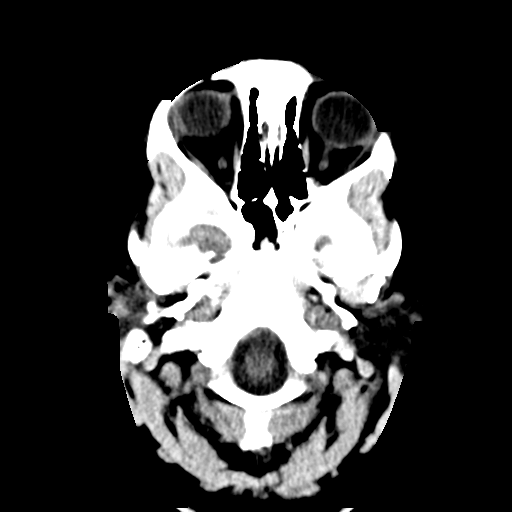
[im 3/30  bone]
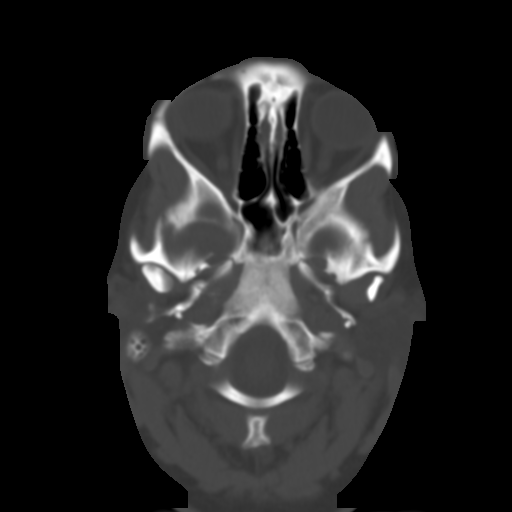
[im 6/30  brain]
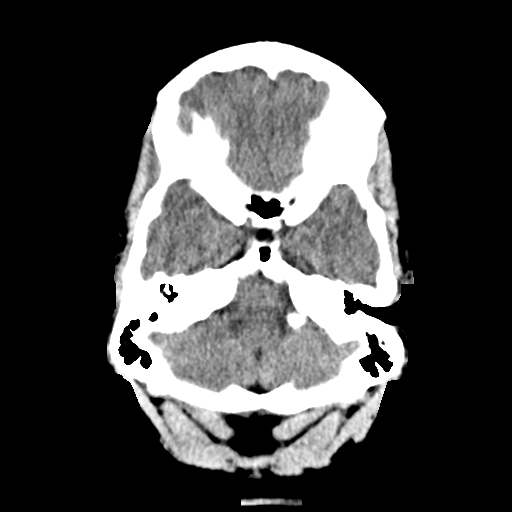
[im 9/30  brain]
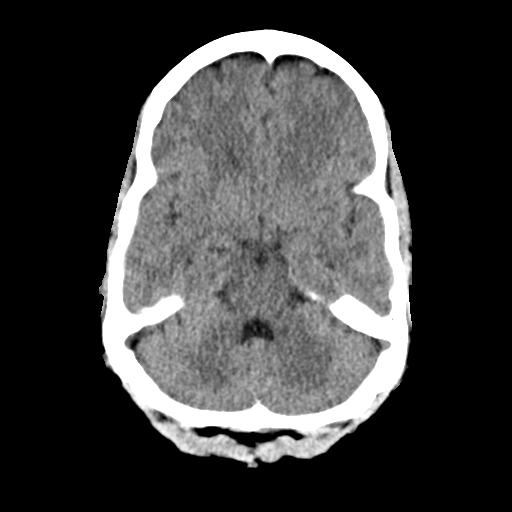
[im 11/30  brain]
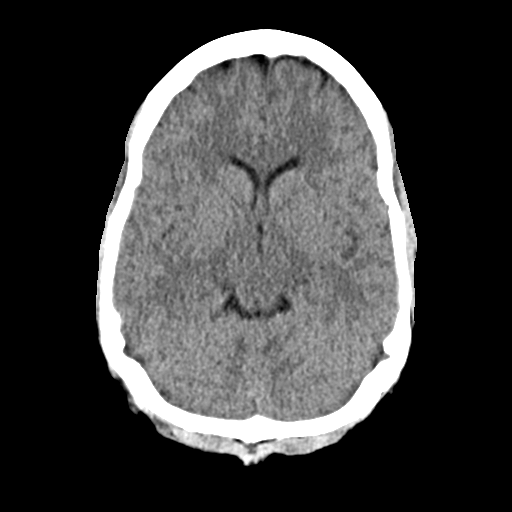
[im 14/30  brain]
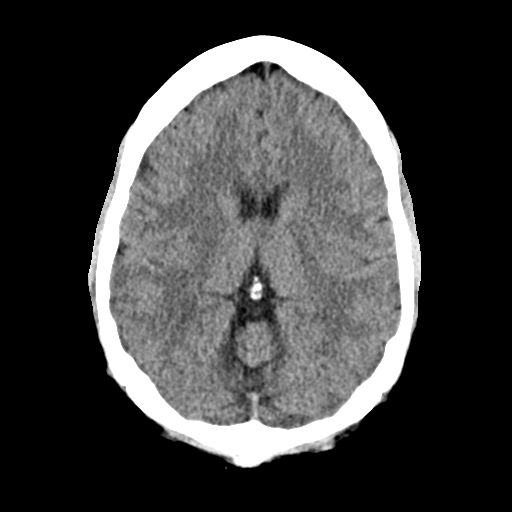
[im 14/30  bone]
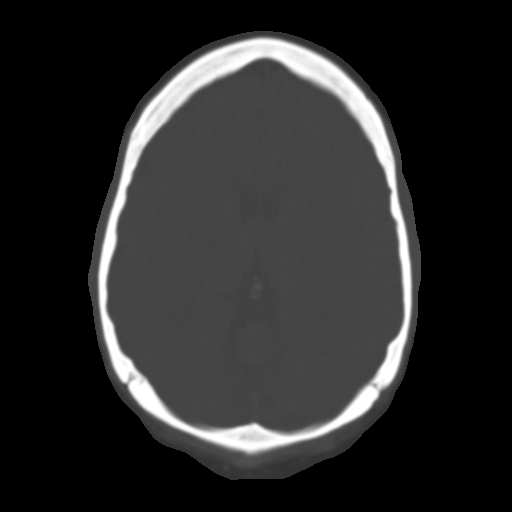
[im 17/30  brain]
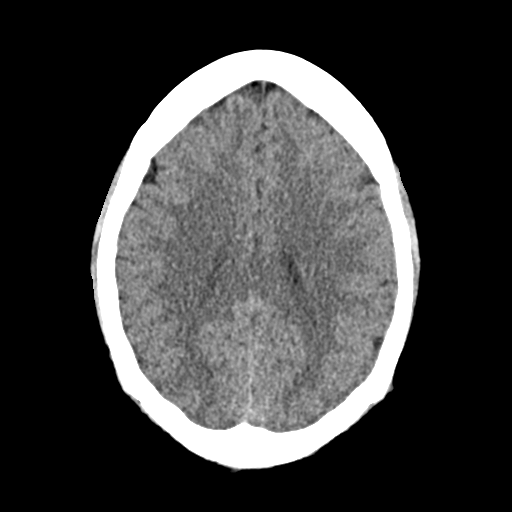
[im 20/30  brain]
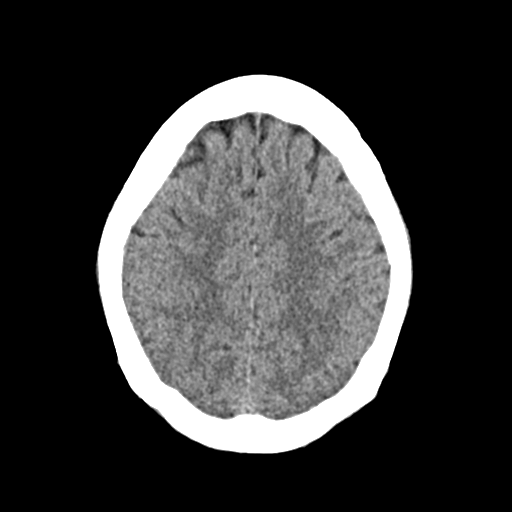
[im 23/30  brain]
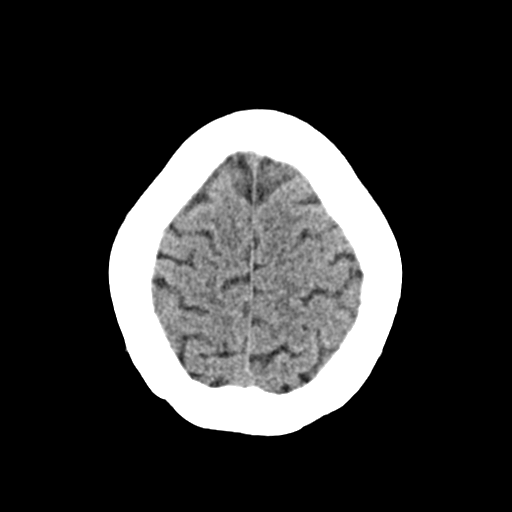
[im 25/30  brain]
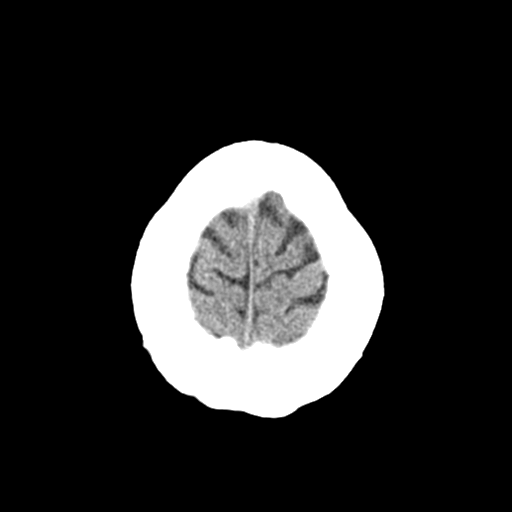
[im 25/30  bone]
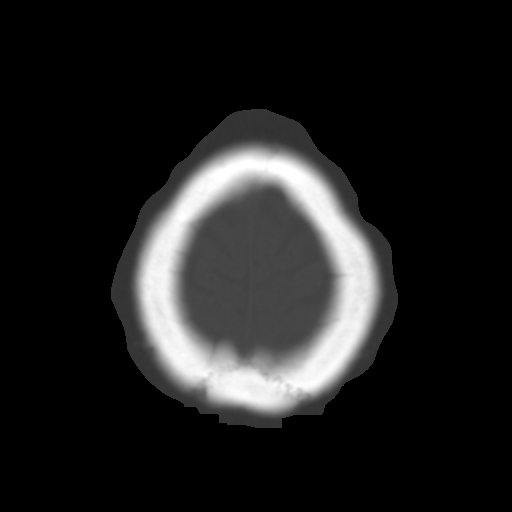
[im 28/30  brain]
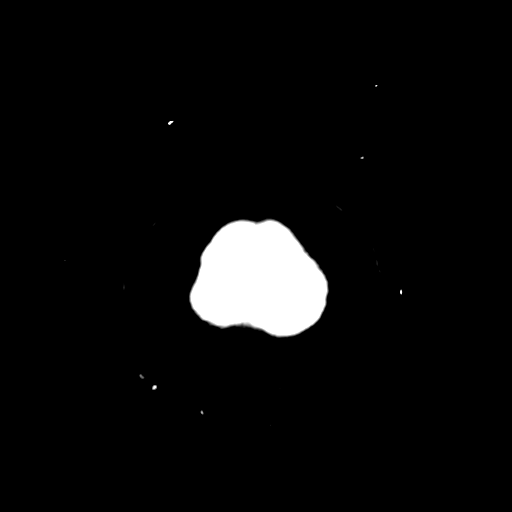

[Series 4: coronal soft · coronal · 0.31mm/px · 3 of 68 slices shown]
[im 23/68  brain]
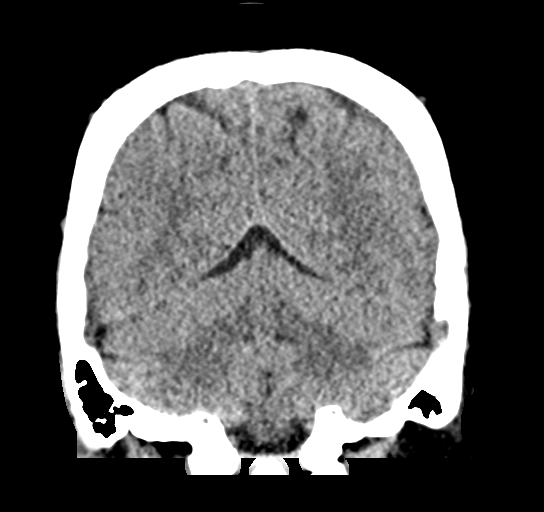
[im 30/68  brain]
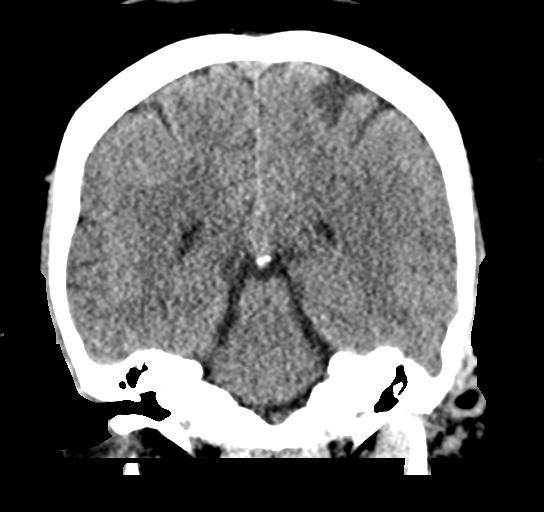
[im 38/68  brain]
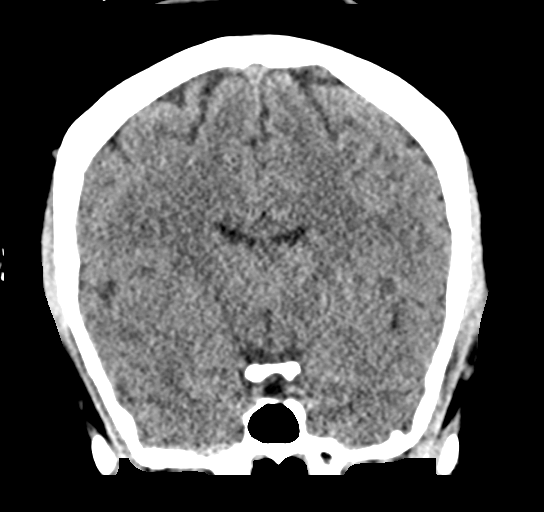

[Series 5: sag soft · sagittal · 0.30mm/px · 3 of 53 slices shown]
[im 18/53  brain]
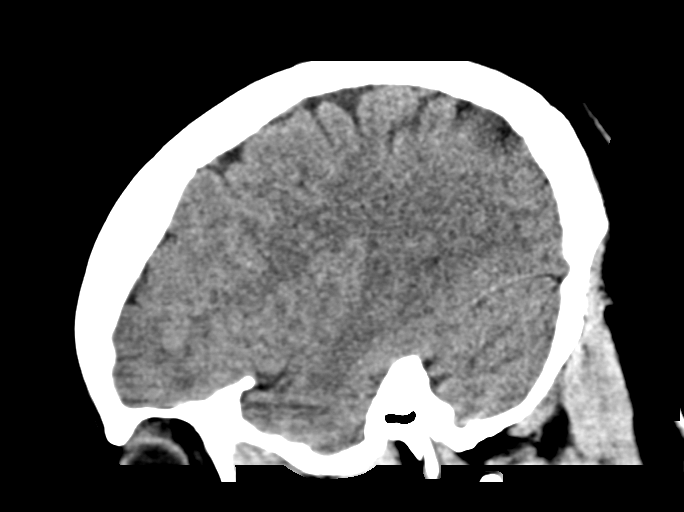
[im 27/53  brain]
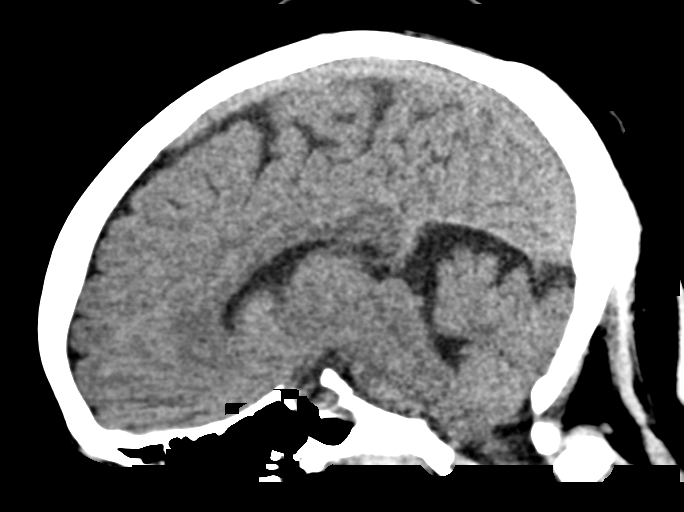
[im 35/53  brain]
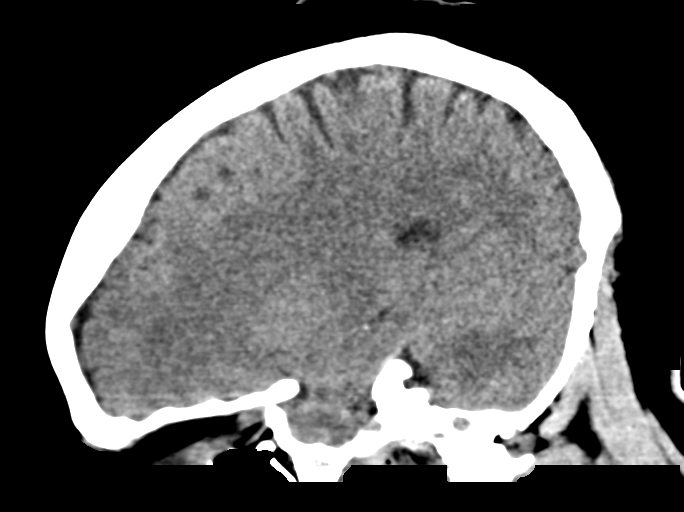

[16 of 47 positions shown; findings below may reference images not displayed]

FINDINGS: Brain: Normal ventricular morphology. No midline shift or mass
effect. Normal appearance of brain parenchyma. No intracranial
hemorrhage, mass lesion, or evidence of acute infarction. No
extra-axial fluid collections.

Vascular: No hyperdense vessels.

Skull: Intact

Sinuses/Orbits: Clear

Other: N/A
IMPRESSION: Normal exam.

## 2023-10-27 ENCOUNTER — Encounter (HOSPITAL_BASED_OUTPATIENT_CLINIC_OR_DEPARTMENT_OTHER): Payer: Self-pay

## 2023-10-27 ENCOUNTER — Other Ambulatory Visit: Payer: Self-pay

## 2023-10-27 ENCOUNTER — Emergency Department (HOSPITAL_BASED_OUTPATIENT_CLINIC_OR_DEPARTMENT_OTHER)
Admission: EM | Admit: 2023-10-27 | Discharge: 2023-10-27 | Disposition: A | Discharge status: Home / Self Care | Attending: Emergency Medicine | Admitting: Emergency Medicine

## 2023-10-27 DIAGNOSIS — R1013 Epigastric pain: Secondary | ICD-10-CM | POA: Insufficient documentation

## 2023-10-27 LAB — COMPREHENSIVE METABOLIC PANEL WITH GFR
ALT: 14 U/L (ref 0–44)
AST: 15 U/L (ref 15–41)
Albumin: 3.8 g/dL (ref 3.5–5.0)
Alkaline Phosphatase: 59 U/L (ref 38–126)
Anion gap: 9 (ref 5–15)
BUN: 11 mg/dL (ref 6–20)
CO2: 26 mmol/L (ref 22–32)
Calcium: 8.9 mg/dL (ref 8.9–10.3)
Chloride: 105 mmol/L (ref 98–111)
Creatinine, Ser: 0.74 mg/dL (ref 0.44–1.00)
GFR, Estimated: >60 mL/min (ref >60)
Glucose, Bld: 115 mg/dL — ABNORMAL HIGH (ref 70–99)
Potassium: 4.4 mmol/L (ref 3.5–5.1)
Sodium: 140 mmol/L (ref 135–145)
Total Bilirubin: <0.2 mg/dL (ref 0.0–1.2)
Total Protein: 6.5 g/dL (ref 6.5–8.1)

## 2023-10-27 LAB — URINALYSIS, ROUTINE W REFLEX MICROSCOPIC
Bilirubin Urine: NEGATIVE
Glucose, UA: NEGATIVE mg/dL
Hgb urine dipstick: NEGATIVE
Ketones, ur: NEGATIVE mg/dL
Nitrite: NEGATIVE
Specific Gravity, Urine: 1.029 (ref 1.005–1.030)
pH: 5.5 (ref 5.0–8.0)

## 2023-10-27 LAB — CBC
HCT: 35.1 % — ABNORMAL LOW (ref 36.0–46.0)
Hemoglobin: 11 g/dL — ABNORMAL LOW (ref 12.0–15.0)
MCH: 26.4 pg (ref 26.0–34.0)
MCHC: 31.3 g/dL (ref 30.0–36.0)
MCV: 84.4 fL (ref 80.0–100.0)
Platelets: 384 10*3/uL (ref 150–400)
RBC: 4.16 MIL/uL (ref 3.87–5.11)
RDW: 15 % (ref 11.5–15.5)
WBC: 6.4 10*3/uL (ref 4.0–10.5)
nRBC: 0 % (ref 0.0–0.2)

## 2023-10-27 LAB — PREGNANCY, URINE: Preg Test, Ur: NEGATIVE

## 2023-10-27 LAB — LIPASE, BLOOD: Lipase: 32 U/L (ref 11–51)

## 2023-10-27 MED ORDER — PANTOPRAZOLE SODIUM 20 MG PO TBEC: 20.0000 mg | DELAYED_RELEASE_TABLET | Freq: Every day | ORAL | 1 refills | Status: DC

## 2023-10-27 MED ORDER — FAMOTIDINE 20 MG PO TABS
20.0000 mg | ORAL_TABLET | Freq: Once | ORAL | Status: AC
Start: 2023-10-27 — End: 2023-10-27
  Administered 2023-10-27: 20 mg via ORAL
  Filled 2023-10-27: qty 1

## 2023-10-27 MED ORDER — ALUM & MAG HYDROXIDE-SIMETH 200-200-20 MG/5ML PO SUSP
30.0000 mL | Freq: Once | ORAL | Status: AC
Start: 2023-10-27 — End: 2023-10-27
  Administered 2023-10-27: 30 mL via ORAL
  Filled 2023-10-27: qty 30

## 2023-10-27 NOTE — ED Provider Notes (Signed)
 [place] EMERGENCY DEPARTMENT AT [facility] Provider Note   CSN: [id number] Arrival date & time: 10/27/23  4098     History  Chief Complaint  Patient presents with   Abdominal Pain    Tomorrow Candace Liu is a 33 y.o. female.   Abdominal Pain   33 year old female presents emergency department with complaints of upper abdominal pain.  States that she has been having this pain intermittently for the past 1 to 2 years.  Reports pain mainly worsened when lying down at night and whenever she eats later in the evening.  States that she has tried a probiotic as well as over-the-counter reflux medicine intermittently since symptom onset with minimal improvement of symptoms.  Reports over the past few nights, pain has radiated to the upper back prompting visit to the emergency department.  States that she feels like her pain is coming from her stomach but recently switched primary care providers has not been able to get referral to GI specialist to be evaluated.  Denies any fevers, chills, nausea, vomit, urinary symptoms, change in bowel habits.  States her last alcoholic beverage was around February.  Denies hematochezia/melena.  Past medical history significant for headache, pseudotumor cerebri, obesity  Home Medications Prior to Admission medications   Medication Sig Start Date End Date Taking? Authorizing Provider  aspirin-acetaminophen -caffeine (EXCEDRIN  MIGRAINE) 250-250-65 MG tablet Take 1 tablet by mouth every 6 (six) hours as needed for headache. 03/11/21   Adolph Hoop, PA-C  cephALEXin  (KEFLEX ) 500 MG capsule Take 1 capsule (500 mg total) by mouth 4 (four) times daily. 05/22/21   Sponseller, Rebekah R, PA-C  ibuprofen  (ADVIL ) 800 MG tablet Take 1 tablet (800 mg total) by mouth 3 (three) times daily. 05/25/20   Wieters, Hallie C, PA-C  methylPREDNISolone  (MEDROL  DOSEPAK) 4 MG TBPK tablet Take as directed by packaging 03/31/21   Dala Dublin, MD  naproxen  (NAPROSYN ) 375 MG tablet  Take 1 tablet (375 mg total) by mouth 2 (two) times daily with a meal. 03/11/21   Adolph Hoop, PA-C  OVER THE COUNTER MEDICATION [facility] acylated alpha-glycans    [provider]  oxyCODONE  (ROXICODONE ) 5 MG immediate release tablet Take 1 tablet (5 mg total) by mouth every 4 (four) hours as needed for severe pain. 05/22/21   Sponseller, Rebekah R, PA-C  topiramate  (TOPAMAX ) 25 MG tablet Take 25 mg (1 pill) at bedtime for one week, then increase to 50 mg (2 pills) at bedtime, then increase to 75 mg (3 pills) at bedtime for one week, then increase to 100 mg (4 pills) at bedtime Patient not taking: Reported on 11/05/2021 03/31/21   Dala Dublin, MD  valACYclovir  (VALTREX ) 1000 MG tablet Take 1 tablet (1,000 mg total) by mouth 3 (three) times daily. 05/22/21   Sponseller, Rebekah R, PA-C      Allergies    Patient has no known allergies.    Review of Systems   Review of Systems  Gastrointestinal:  Positive for abdominal pain.  All other systems reviewed and are negative.   Physical Exam Updated Vital Signs There were no vitals taken for this visit. Physical Exam Vitals and nursing note reviewed.  Constitutional:      General: She is not in acute distress.    Appearance: She is well-developed.  HENT:     Head: Normocephalic and atraumatic.  Eyes:     Conjunctiva/sclera: Conjunctivae normal.  Cardiovascular:     Rate and Rhythm: Normal rate and regular rhythm.     Pulses: Normal  pulses.     Heart sounds: No murmur heard. Pulmonary:     Effort: Pulmonary effort is normal. No respiratory distress.     Breath sounds: Normal breath sounds.  Abdominal:     Palpations: Abdomen is soft.     Tenderness: There is abdominal tenderness in the epigastric area. There is no right CVA tenderness, left CVA tenderness or guarding. Negative signs include Murphy's sign.     Comments: Minimal epigastric tenderness to deep palpation.  Musculoskeletal:        General: No swelling.     Cervical  back: Neck supple.  Skin:    General: Skin is warm and dry.     Capillary Refill: Capillary refill takes less than 2 seconds.  Neurological:     Mental Status: She is alert.  Psychiatric:        Mood and Affect: Mood normal.     ED Results / Procedures / Treatments   Labs (all labs ordered are listed, but only abnormal results are displayed) Labs Reviewed - No data to display  EKG None  Radiology No results found.  Procedures Procedures    Medications Ordered in ED Medications - No data to display  ED Course/ Medical Decision Making/ A&P                                 Medical Decision Making Amount and/or Complexity of Data Reviewed Labs: ordered.  Risk OTC drugs. Prescription drug management.   This patient presents to the ED for concern of abdominal pain, this involves an extensive number of treatment options, and is a complaint that carries with it a high risk of complications and morbidity.  The differential diagnosis includes gastritis, PUD, cholecystitis, CBD pathology, SBO/other laryngotracheitis, appendicitis, ectopic pregnancy, ovarian torsion, tubo-ovarian abscess nephrolithiasis, pyelonephritis, gastroenteritis, other   Co morbidities that complicate the patient evaluation  See HPI   Additional history obtained:  Additional history obtained from EMR External records from outside source obtained and reviewed including hospital records   Lab Tests:  I Ordered, and personally interpreted labs.  The pertinent results include: UA contaminated sample with 0-5 BCs, 6-10 WBCs with trace proteins.  Urine pregnancy negative.  No leukocytosis.  Anemia with a hemoglobin of 11.  Platelets within range.  No electrolyte abnormalities.  No transaminitis.  No evidence of PE.  Lipase within normal limits.   Imaging Studies ordered:  N/a   Cardiac Monitoring: / EKG:  The patient was maintained on a cardiac monitor.  I personally viewed and interpreted the  cardiac monitored which showed an underlying rhythm of: Sinus rhythm   Consultations Obtained:  N/a   Problem List / ED Course / Critical interventions / Medication management  Epigastric abdominal pain I ordered medication including Maalox, Pepcid    Reevaluation of the patient after these medicines showed that the patient improved I have reviewed the patients home medicines and have made adjustments as needed   Social Determinants of Health:  Denies tobacco, cigarette use.   Test / Admission - Considered:  Epigastric abdominal pain Vitals signs within normal range and stable throughout visit. Laboratory studies significant for: See above 33 year old female presents emergency department with complaints of upper abdominal pain.  States that she has been having this pain intermittently for the past 1 to 2 years.  Reports pain mainly worsened when lying down at night and whenever she eats later in the evening.  States  that she has tried a probiotic as well as over-the-counter reflux medicine intermittently since symptom onset with minimal improvement of symptoms.  Reports over the past few nights, pain has radiated to the upper back prompting visit to the emergency department.  States that she feels like her pain is coming from her stomach but recently switched primary care providers has not been able to get referral to GI specialist to be evaluated.  Denies any fevers, chills, nausea, vomit, urinary symptoms, change in bowel habits.  States her last alcoholic beverage was around February.  Denies hematochezia/melena. On exam, minimal tenderness in epigastric region on deep palpation, otherwise benign abdominal exam.  Laboratory studies reassuring.  Treated with GI cocktail did note improvement.  Suspect gastritis versus PUD as cause of patient's symptoms given symptoms mainly worsened with eating lighter meals, lying down flat at night as well as exacerbated by alcohol consumption when she  would consume it.  Will place patient on PPI and give GI referral in the outpatient setting for further assessment/evaluation.  Treatment plan discussed with patient and she acknowledged understanding was agreeable to said plan.  Patient overall well-appearing, afebrile in no acute distress. Worrisome signs and symptoms were discussed with the patient, and the patient acknowledged understanding to return to the ED if noticed. Patient was stable upon discharge.          Final Clinical Impression(s) / ED Diagnoses Final diagnoses:  None    Rx / DC Orders ED Discharge Orders     None         [place] Butter, [place] 10/27/23 1036    Auston Blush, MD 11/02/23 1315

## 2023-10-27 NOTE — ED Triage Notes (Signed)
 In for eval of upper abd pain radiating to right side of back. Feels pain at night while lying down. Denies N/V/D. Last BM this am and normal.

## 2023-10-27 NOTE — ED Notes (Signed)
Patient called x 2 with no answer 

## 2023-10-27 NOTE — Discharge Instructions (Addendum)
 As discussed, I suspect your symptoms are likely secondary to gastritis versus GERD versus peptic ulcer disease.  Your blood work today was reassuring.  Will place you on reflux medicine called Protonix.  Please take daily as directed.  See information attached to your discharge papers regarding dietary/lifestyle changes to decrease likelihood of having exacerbations of pain.  Will send a referral for GI specialist as well as give you the information.  Please do not hesitate to return to the emergency department if the worrisome signs and symptoms we discussed become apparent.

## 2023-10-28 ENCOUNTER — Encounter: Payer: Self-pay | Admitting: Gastroenterology

## 2023-11-16 ENCOUNTER — Inpatient Hospital Stay: Attending: Genetic Counselor | Admitting: Genetic Counselor

## 2023-11-16 ENCOUNTER — Encounter: Payer: Self-pay | Admitting: Genetic Counselor

## 2023-11-16 ENCOUNTER — Encounter: Admitting: Genetic Counselor

## 2023-11-16 ENCOUNTER — Other Ambulatory Visit

## 2023-11-16 ENCOUNTER — Inpatient Hospital Stay

## 2023-11-16 DIAGNOSIS — Z803 Family history of malignant neoplasm of breast: Secondary | ICD-10-CM | POA: Diagnosis present

## 2023-11-16 NOTE — Progress Notes (Signed)
 REFERRING PROVIDER: Julianne Octave, MD [address] ,  [place] [address]  PRIMARY PROVIDER:  Patient, No Pcp Per  PRIMARY REASON FOR VISIT:  1. Family history of breast cancer    HISTORY OF PRESENT ILLNESS:   Candace Liu, a 33 y.o. female, was seen for a [place] cancer genetics consultation at the request of Dr. Thurmon [place] due to a family history of cancer.  Candace Liu presents to clinic today to discuss the possibility of a hereditary predisposition to cancer, to discuss genetic testing, and to further clarify her future cancer risks, as well as potential cancer risks for family members.   Candace Liu is a 33 y.o. female with no personal history of cancer.    RISK FACTORS:  Menarche was at age 40.  First live birth at age 27.  OCP use for approximately 1 year.  Ovaries intact: yes.  Uterus intact: yes.  Menopausal status: premenopausal.  HRT use: 0 years. Colonoscopy: no Mammogram within the last year: yes. Number of breast biopsies: 0. Any excessive radiation exposure in the past: no  Past Medical History:  Diagnosis Date   Headache    No pertinent past medical history    Obesity    Pseudotumor cerebri     Past Surgical History:  Procedure Laterality Date   NO PAST SURGERIES      Social History   Socioeconomic History   Marital status: Single    Spouse name: Not on file   Number of children: 2   Years of education: college   Highest education level: Not on file  Occupational History   Not on file  Tobacco Use   Smoking status: Never   Smokeless tobacco: Never  Vaping Use   Vaping status: Never Used  Substance and Sexual Activity   Alcohol use: Yes    Comment: socially   Drug use: No   Sexual activity: Yes    Birth control/protection: None  Other Topics Concern   Not on file  Social History Narrative   Patient does not drink caffeine but take green tea vitamins.   Patient is right handed.   Social Drivers of Corporate investment banker  Strain: Not on file  Food Insecurity: Not on file  Transportation Needs: Not on file  Physical Activity: Not on file  Stress: Not on file  Social Connections: Unknown (12/14/2022)   Received from [facility]   Social Network    Social Network: Not on file     FAMILY HISTORY:  We obtained a detailed, 4-generation family history.  Significant diagnoses are listed below: Family History  Problem Relation Age of Onset   Healthy Mother    Healthy Father    Heart disease Brother        MI   Breast cancer Maternal Aunt 45 - 62   Breast cancer Paternal Aunt 69 - 49   Breast cancer Paternal Aunt 33 - 6   Brain cancer Paternal Grandfather 44 - 69       possibly cervical cancer instead of brain   Sleep apnea Cousin    Breast cancer Cousin 46 - 15       paternal first cousin   Breast cancer Cousin 32 - 67       paternal first cousin   Migraines Neg Hx       Candace Liu is unaware of previous family history of genetic testing for hereditary cancer risks. There is no reported Ashkenazi Jewish ancestry.  GENETIC  COUNSELING ASSESSMENT: Candace Liu is a 33 y.o. female with a family history of cancer which is somewhat suggestive of a hereditary predisposition to cancer. We, therefore, discussed and recommended the following at today's visit.   DISCUSSION: We discussed that 5 - 10% of cancer is hereditary, with most cases of hereditary breast cancer associated with BRCA1/2.  There are other genes that can be associated with hereditary breast cancer syndromes.  We discussed that testing is beneficial for several reasons, including knowing about other cancer risks, identifying potential screening and risk-reduction options that may be appropriate, and to understanding if other family members could be at risk for cancer and allowing them to undergo genetic testing.  We reviewed the characteristics, features and inheritance patterns of hereditary cancer syndromes. We also discussed genetic testing,  including the appropriate family members to test, the process of testing, insurance coverage and turn-around-time for results. We discussed the implications of a negative, positive, carrier and/or variant of uncertain significant result. We discussed that negative results would be uninformative given that Candace Liu does not have a personal history of cancer. We recommended Candace Liu pursue genetic testing for a panel that contains genes associated with breast cancer.  Candace Liu was offered a common hereditary cancer panel (40 genes) and an expanded pan-cancer panel (70 genes). Candace Liu was informed of the benefits and limitations of each panel, including that expanded pan-cancer panels contain several genes that do not have clear management guidelines at this point in time.  We also discussed that as the number of genes included on a panel increases, the chances of variants of uncertain significance increases.   Based on Candace Liu's family history of cancer, she meets medical criteria for genetic testing. Though Candace Liu is not personally affected, there are no affected family members that are willing/able to undergo hereditary cancer testing. Despite that she meets criteria, she may still have an out of pocket cost. We discussed that if her out of pocket cost for testing is over $100, the laboratory should contact them to discuss self-pay prices, patient pay assistance programs, if applicable, and other billing options.  We discussed that some people do not want to undergo genetic testing due to fear of genetic discrimination.  A federal law called the Genetic Information Non-Discrimination Act (GINA) of 2008 helps protect individuals against genetic discrimination based on their genetic test results.  It impacts both health insurance and employment.  With health insurance, it protects against increased premiums, being kicked off insurance or being forced to take a test in order to be insured.  For  employment it protects against hiring, firing and promoting decisions based on genetic test results.  GINA does not apply to those in the [employer], those who work for companies with less than 15 employees, and new life insurance or long-term disability insurance policies.  Health status due to a cancer diagnosis is not protected under GINA.  PLAN: Due to the limitations of GINA, Ms. Sanpedro decided not to pursue testing at today's visit. We understand this decision and remain available to coordinate genetic testing at any time in the future. We, therefore, recommend Ms. Lunsford continue to follow the cancer screening guidelines given by her primary healthcare provider.  Ms. Marin questions were answered to her satisfaction today. Our contact information was provided should additional questions or concerns arise. Thank you for the referral and allowing us  to share in the care of your patient.   Makynna Manocchio, MS, [facility] Genetic Counselor [email or web address] (  P) [phone]  75 minutes were spent on the date of the encounter in service to the patient including preparation, face-to-face consultation, documentation and care coordination. The patient was seen alone.  Drs. Gudena and/or Maryalice Smaller were available to discuss this case as needed.  _______________________________________________________________________ For Office Staff:  Number of people involved in session: 1 Was an Intern/ student involved with case: yes

## 2023-12-13 ENCOUNTER — Ambulatory Visit: Admitting: Gastroenterology

## 2023-12-13 ENCOUNTER — Encounter: Payer: Self-pay | Admitting: Gastroenterology

## 2023-12-13 ENCOUNTER — Other Ambulatory Visit (INDEPENDENT_AMBULATORY_CARE_PROVIDER_SITE_OTHER)

## 2023-12-13 VITALS — BP 122/62 | HR 98 | Ht 64.0 in | Wt 241.0 lb

## 2023-12-13 DIAGNOSIS — R1013 Epigastric pain: Secondary | ICD-10-CM

## 2023-12-13 DIAGNOSIS — D649 Anemia, unspecified: Secondary | ICD-10-CM

## 2023-12-13 LAB — IBC + FERRITIN
Ferritin: 9 ng/mL — ABNORMAL LOW (ref 10.0–291.0)
Iron: 34 ug/dL — ABNORMAL LOW (ref 42–145)
Saturation Ratios: 7.7 % — ABNORMAL LOW (ref 20.0–50.0)
TIBC: 442.4 ug/dL (ref 250.0–450.0)
Transferrin: 316 mg/dL (ref 212.0–360.0)

## 2023-12-13 LAB — CBC WITH DIFFERENTIAL/PLATELET
Basophils Absolute: 0 10*3/uL (ref 0.0–0.1)
Basophils Relative: 0.2 % (ref 0.0–3.0)
Eosinophils Absolute: 0 10*3/uL (ref 0.0–0.7)
Eosinophils Relative: 0.4 % (ref 0.0–5.0)
HCT: 35 % — ABNORMAL LOW (ref 36.0–46.0)
Hemoglobin: 11.2 g/dL — ABNORMAL LOW (ref 12.0–15.0)
Lymphocytes Relative: 29.6 % (ref 12.0–46.0)
Lymphs Abs: 2.4 10*3/uL (ref 0.7–4.0)
MCHC: 32 g/dL (ref 30.0–36.0)
MCV: 81.5 fl (ref 78.0–100.0)
Monocytes Absolute: 0.9 10*3/uL (ref 0.1–1.0)
Monocytes Relative: 10.9 % (ref 3.0–12.0)
Neutro Abs: 4.7 10*3/uL (ref 1.4–7.7)
Neutrophils Relative %: 58.9 % (ref 43.0–77.0)
Platelets: 408 10*3/uL — ABNORMAL HIGH (ref 150.0–400.0)
RBC: 4.29 Mil/uL (ref 3.87–5.11)
RDW: 16.2 % — ABNORMAL HIGH (ref 11.5–15.5)
WBC: 8 10*3/uL (ref 4.0–10.5)

## 2023-12-13 LAB — COMPREHENSIVE METABOLIC PANEL WITH GFR
ALT: 15 U/L (ref 0–35)
AST: 12 U/L (ref 0–37)
Albumin: 4.1 g/dL (ref 3.5–5.2)
Alkaline Phosphatase: 57 U/L (ref 39–117)
BUN: 14 mg/dL (ref 6–23)
CO2: 25 meq/L (ref 19–32)
Calcium: 8.7 mg/dL (ref 8.4–10.5)
Chloride: 105 meq/L (ref 96–112)
Creatinine, Ser: 0.73 mg/dL (ref 0.40–1.20)
GFR: 106.59 mL/min (ref >60.00)
Glucose, Bld: 105 mg/dL — ABNORMAL HIGH (ref 70–99)
Potassium: 4.6 meq/L (ref 3.5–5.1)
Sodium: 137 meq/L (ref 135–145)
Total Bilirubin: 0.3 mg/dL (ref 0.2–1.2)
Total Protein: 7.3 g/dL (ref 6.0–8.3)

## 2023-12-13 LAB — B12 AND FOLATE PANEL
Folate: 17.7 ng/mL (ref >5.9)
Vitamin B-12: 430 pg/mL (ref 211–911)

## 2023-12-13 MED ORDER — PANTOPRAZOLE SODIUM 40 MG PO TBEC: 40.0000 mg | DELAYED_RELEASE_TABLET | Freq: Two times a day (BID) | ORAL | 3 refills | Status: AC

## 2023-12-13 NOTE — Progress Notes (Signed)
 Candace Liu [id number] 1991-05-03   Chief Complaint: Epigastric pain  Referring Provider: Silver Wonda LABOR, PA Primary GI MD: Sampson  HPI: Candace Liu is a 33 y.o. female with past medical history of obesity, pseudotumor cerebri who presents today for a complaint of abdominal pain.    10/27/2023 patient seen in the ED for complaint of upper abdominal pain which had been ongoing intermittently for the last 1 to 2 years.  Pain worse with lying down at night and when eating later in the evening.  Reported she had tried OTC reflux medications intermittently with minimal improvement of symptoms.  For a few nights prior to ED visit, pain had radiated to her upper back which prompted her to seek medical attention.  She had improvement with GI cocktail.  Lab studies reassuring.  No abdominal imaging done.  Gastritis versus PUD suspected.  She was started on PPI and given GI referral for further evaluation.  Labs 10/27/2023: Hemoglobin 11, hematocrit 35.1, MCV 84.4, normal platelets, normal CMP, normal lipase, negative pregnancy test  Currently on Protonix  20 mg daily.  Has had normocytic anemia which has been present as far back as 2017.  Patient states she has had upper abdominal pain which occurs at night when she lays down.  Describes pain as being somewhat sharp but also feels like a lot of pressure in her upper abdomen.  States she had an ultrasound ordered by her PCP last year which was normal.  Pain only recently started radiating to her back.  She denies symptoms during the day.  Pain does wake her up at night.  She denies any improvement in pain with Protonix .  She has noticed worsening pain after eating late at night before laying down.  States she has occasional heartburn with certain foods, but tries to avoid these.  Denies any acid reflux.  She has occasional nausea associated with her migraine headaches, but denies any nausea otherwise.  Denies vomiting.  Denies dysphagia.  She  reports regular bowel movements.  Denies any diarrhea, constipation, melena, blood in stool.  Denies family history of colon or stomach cancer.  Previous GI Procedures/Imaging   None  Past Medical History:  Diagnosis Date   Headache    No pertinent past medical history    Obesity    Pseudotumor cerebri     Past Surgical History:  Procedure Laterality Date   NO PAST SURGERIES      Current Outpatient Medications  Medication Sig Dispense Refill   pantoprazole  (PROTONIX ) 20 MG tablet Take 1 tablet (20 mg total) by mouth daily. 30 tablet 1   topiramate  (TOPAMAX ) 25 MG tablet Take 25 mg (1 pill) at bedtime for one week, then increase to 50 mg (2 pills) at bedtime, then increase to 75 mg (3 pills) at bedtime for one week, then increase to 100 mg (4 pills) at bedtime (Patient not taking: Reported on 11/05/2021) 120 tablet 3   No current facility-administered medications for this visit.    Allergies as of 12/13/2023   (No Known Allergies)    Family History  Problem Relation Age of Onset   Healthy Mother    Healthy Father    Heart disease Brother        MI   Breast cancer Maternal Aunt 59 - 65   Breast cancer Paternal Aunt 14 - 33   Breast cancer Paternal Aunt 16 - 43   Brain cancer Paternal Grandfather 80 - 69       possibly cervical  cancer instead of brain   Sleep apnea Cousin    Breast cancer Cousin 75 - 60       paternal first cousin   Breast cancer Cousin 57 - 34       paternal first cousin   Migraines Neg Hx     Social History   Tobacco Use   Smoking status: Never   Smokeless tobacco: Never  Vaping Use   Vaping status: Never Used  Substance Use Topics   Alcohol use: Yes    Comment: socially   Drug use: No     Review of Systems:    Constitutional: No weight loss, fever, chills, weakness or fatigue Eyes: No change in vision Ears, Nose, Throat:  No change in hearing or congestion Skin: No rash or itching Cardiovascular: No chest pain, chest pressure or  palpitations   Respiratory: No SOB or cough Gastrointestinal: See HPI and otherwise negative Genitourinary: No dysuria or change in urinary frequency Neurological: No dizziness or syncope Musculoskeletal: No new muscle or joint pain Hematologic: No bleeding or bruising    Physical Exam:  Vital signs: BP 122/62   Pulse 98   Ht 5' 4 (1.626 m)   Wt 241 lb (109.3 kg)   BMI 41.37 kg/m    Constitutional: Pleasant, obese female in NAD, alert and cooperative Head:  Normocephalic and atraumatic.  Eyes: No scleral icterus. Conjunctiva pink. Respiratory: Respirations even and unlabored. Lungs clear to auscultation bilaterally.  No wheezes, crackles, or rhonchi.  Cardiovascular:  Regular rate and rhythm. No murmurs. No peripheral edema. Gastrointestinal:  Soft, nondistended, nontender. No rebound or guarding. Normal bowel sounds. No appreciable masses or hepatomegaly. Rectal:  Not performed.  Neurologic:  Alert and oriented x4;  grossly normal neurologically.  Skin:   Dry and intact without significant lesions or rashes. Psychiatric: Oriented to person, place and time. Demonstrates good judgement and reason without abnormal affect or behaviors.   RELEVANT LABS AND IMAGING: CBC    Component Value Date/Time   WBC 6.4 10/27/2023 0947   RBC 4.16 10/27/2023 0947   HGB 11.0 (L) 10/27/2023 0947   HGB 12.1 11/16/2016 1526   HCT 35.1 (L) 10/27/2023 0947   HCT 37.7 11/16/2016 1526   PLT 384 10/27/2023 0947   PLT 352 11/16/2016 1526   MCV 84.4 10/27/2023 0947   MCV 87 11/16/2016 1526   MCH 26.4 10/27/2023 0947   MCHC 31.3 10/27/2023 0947   RDW 15.0 10/27/2023 0947   RDW 16.4 (H) 11/16/2016 1526   LYMPHSABS 1.8 03/17/2016 1326   MONOABS 0.5 03/15/2013 1935   EOSABS 0.0 03/17/2016 1326   BASOSABS 0.0 03/17/2016 1326    CMP     Component Value Date/Time   NA 140 10/27/2023 0947   K 4.4 10/27/2023 0947   CL 105 10/27/2023 0947   CO2 26 10/27/2023 0947   GLUCOSE 115 (H) 10/27/2023  0947   BUN 11 10/27/2023 0947   CREATININE 0.74 10/27/2023 0947   CALCIUM 8.9 10/27/2023 0947   PROT 6.5 10/27/2023 0947   ALBUMIN 3.8 10/27/2023 0947   AST 15 10/27/2023 0947   ALT 14 10/27/2023 0947   ALKPHOS 59 10/27/2023 0947   BILITOT <0.2 10/27/2023 0947   GFRNONAA >60 10/27/2023 0947   GFRAA >60 05/19/2016 1337     Assessment/Plan:   Epigastric pain Anemia Patient reporting 1 to 2 years of epigastric abdominal pain which occurs when she lays down at night and is worsened by eating late in the evening before  going to bed.  No pain on exam today. Denies any improvement with Protonix .  She was only taking 20 mg daily, and has stopped taking. She has occasional heartburn with certain foods which she avoids.  Denies melena.  Has nausea associated with migraine headaches only, no vomiting.  She reports having a normal abdominal ultrasound last year. Based on chart review she has history of anemia.  She was unaware of this but does vaguely recall possibility of vitamin deficiency in the past. Differential includes gastritis and PUD.  - Check labs today: CBC, CMP, iron panel, ferritin, vitamin B12, folate, TTG, IgA, Diatherix H. Pylori - Start Protonix  40 mg twice daily - Follow up in 6-8 weeks - If no improvement at follow up will plan for EGD   Camie Furbish, PA-C [facility] 12/13/2023, 1:14 PM  Patient Care Team: Patient, No Pcp Per as PCP - General (General Practice)

## 2023-12-13 NOTE — Patient Instructions (Signed)
 We have sent the following medications to your pharmacy for you to pick up at your convenience: Pantoprazole  40 mg twice daily 30-60 minutes before breakfast and dinner.  Your provider has requested that you go to the basement level for lab work before leaving today. Press B on the elevator. The lab is located at the first door on the left as you exit the elevator.  Your provider has ordered Diatherix stool testing for you. You have received a kit from our office today containing all necessary supplies to complete this test. Please carefully read the stool collection instructions provided in the kit before opening the accompanying materials. In addition, be sure there is a label providing your full name and date of birth on the puritan opti-swab tube that is supplied in the kit (if you do not see a label with this information on your test tube, please make us  aware before test collection!). After completing the test, you should secure the purtian tube into the specimen biohazard bag. The [facility] E-Req sheet (including date and time of specimen collection) should be placed into the outside pocket of the specimen biohazard bag and returned to the [place] lab (basement floor of [facility]) within 3 days of collection. Please make sure to give the specimen to a staff member at the lab. DO NOT leave the specimen on the counter.   If the specimen date and time (can be found in the upper right boxed portion of the sheet) are not filled out on the E-Req sheet, the test will NOT be performed.

## 2023-12-14 LAB — TISSUE TRANSGLUTAMINASE ABS,IGG,IGA
(tTG) Ab, IgA: <1 U/mL
(tTG) Ab, IgG: <1 U/mL

## 2023-12-14 LAB — IGA: Immunoglobulin A: 263 mg/dL (ref 47–310)

## 2023-12-15 ENCOUNTER — Ambulatory Visit: Payer: Self-pay | Admitting: Gastroenterology

## 2023-12-22 NOTE — Progress Notes (Signed)
 Attending Physician's Attestation   I have reviewed the chart.   I agree with the Advanced Practitioner's note, impression, and recommendations with any updates as below. Agree with further titrating PPI to see if this will be helpful.  If not, additional imaging as well as endoscopy will be considered.  If patient has evidence of iron deficiency, would consider role of upper and lower endoscopy and that workup as well.   Aloha Finner, MD [facility] Advanced Endoscopy Office # [phone]

## 2023-12-24 ENCOUNTER — Encounter: Payer: Self-pay | Admitting: General Practice

## 2024-01-14 ENCOUNTER — Encounter: Admitting: Family

## 2024-01-14 NOTE — Progress Notes (Signed)
 Erroneous encounter-disregard

## 2024-02-01 ENCOUNTER — Ambulatory Visit: Admitting: Gastroenterology

## 2024-02-25 ENCOUNTER — Ambulatory Visit (INDEPENDENT_AMBULATORY_CARE_PROVIDER_SITE_OTHER): Admitting: Family

## 2024-02-25 ENCOUNTER — Encounter: Payer: Self-pay | Admitting: Family

## 2024-02-25 VITALS — BP 108/74 | HR 79 | Temp 98.8°F | Resp 16 | Ht 64.0 in | Wt 238.8 lb

## 2024-02-25 DIAGNOSIS — Z23 Encounter for immunization: Secondary | ICD-10-CM | POA: Diagnosis not present

## 2024-02-25 DIAGNOSIS — Z809 Family history of malignant neoplasm, unspecified: Secondary | ICD-10-CM

## 2024-02-25 DIAGNOSIS — F419 Anxiety disorder, unspecified: Secondary | ICD-10-CM | POA: Diagnosis not present

## 2024-02-25 DIAGNOSIS — F32A Depression, unspecified: Secondary | ICD-10-CM | POA: Diagnosis not present

## 2024-02-25 DIAGNOSIS — Z7689 Persons encountering health services in other specified circumstances: Secondary | ICD-10-CM

## 2024-02-25 NOTE — Progress Notes (Signed)
 Subjective:    Candace Liu - 33 y.o. female MRN [id number]  Date of birth: May 01, 1991  HPI  Candace Liu is to establish care.   Current issues and/or concerns: - Anxiety depression. Requests referral to therapist. She denies thoughts of self-harm, suicidal ideations, homicidal ideations.  ROS per HPI    Health Maintenance:  Health Maintenance Due  Topic Date Due   Hepatitis C Screening  Never done   Cervical Cancer Screening (HPV/Pap Cotest)  03/18/2019     Past Medical History: Patient Active Problem List   Diagnosis Date Noted   Postpartum hemorrhage 10/01/2016   Supervision of normal pregnancy, antepartum 05/11/2016   Not immune to rubella 04/14/2016   GBS (group B streptococcus) UTI complicating pregnancy 04/14/2016      Social History   reports that she has never smoked. She has never used smokeless tobacco. She reports current alcohol use. She reports that she does not use drugs.   Family History  family history includes Brain cancer (age of onset: 16 - 78) in her paternal grandfather; Breast cancer (age of onset: 6 - 30) in her cousin, cousin, and paternal aunt; Breast cancer (age of onset: 29 - 11) in her maternal aunt; Breast cancer (age of onset: 70 - 10) in her paternal aunt; Healthy in her father and mother; Heart disease in her brother; Sleep apnea in her cousin.   Medications: reviewed and updated   Objective:   Physical Exam BP 108/74   Pulse 79   Temp 98.8 F (37.1 C) (Oral)   Resp 16   Ht 5' 4 (1.626 m)   Wt 238 lb 12.8 oz (108.3 kg)   LMP 02/20/2024 (Exact Date)   SpO2 97%   BMI 40.99 kg/m   Physical Exam HENT:     Head: Normocephalic and atraumatic.     Nose: Nose normal.     Mouth/Throat:     Mouth: Mucous membranes are moist.     Pharynx: Oropharynx is clear.  Eyes:     Extraocular Movements: Extraocular movements intact.     Conjunctiva/sclera: Conjunctivae normal.     Pupils: Pupils are equal, round, and reactive to light.   Cardiovascular:     Rate and Rhythm: Normal rate and regular rhythm.     Pulses: Normal pulses.     Heart sounds: Normal heart sounds.  Pulmonary:     Effort: Pulmonary effort is normal.     Breath sounds: Normal breath sounds.  Musculoskeletal:        General: Normal range of motion.     Cervical back: Normal range of motion and neck supple.  Neurological:     General: No focal deficit present.     Mental Status: She is alert and oriented to person, place, and time.  Psychiatric:        Mood and Affect: Mood normal.        Behavior: Behavior normal.     Assessment & Plan:  1. Encounter to establish care (Primary) - Patient presents today to establish care. During the interim follow-up with primary provider as scheduled.  - Return for annual physical examination, labs, and health maintenance. Arrive fasting meaning having no food for at least 8 hours prior to appointment. You may have only water or black coffee. Please take scheduled medications as normal.  2. Anxiety and depression - Patient denies thoughts of self-harm, suicidal ideations, homicidal ideations. - Patient declined pharmacological therapy.  - Referral to Psychiatry for evaluation/management.  - Follow-up  with primary provider as scheduled.  - Ambulatory referral to Psychiatry  3. Flu vaccine need - Administered. - Flu vaccine trivalent PF, 6mos and older(Flulaval,Afluria,Fluarix,Fluzone)   Patient was given clear instructions to go to Emergency Department or return to medical center if symptoms don't improve, worsen, or new problems develop.The patient verbalized understanding.  I discussed the assessment and treatment plan with the patient. The patient was provided an opportunity to ask questions and all were answered. The patient agreed with the plan and demonstrated an understanding of the instructions.   The patient was advised to call back or seek an in-person evaluation if the symptoms worsen or if the  condition fails to improve as anticipated.    Greig Drones, NP 02/25/2024, 1:50 PM Primary Care at [facility]

## 2024-02-25 NOTE — Progress Notes (Signed)
 Patient scored a 6 on the PHQ-9, patient scored a 9 on the GAD-7

## 2024-03-16 ENCOUNTER — Ambulatory Visit: Admitting: Gastroenterology

## 2024-03-16 NOTE — Progress Notes (Deleted)
 Candace Liu [id number] 1990-09-06   Chief Complaint:  Referring Provider: No ref. provider found Primary GI MD: Dr. Wilhelmenia  HPI: Candace Liu is a 32 y.o. female with past medical history of obesity, pseudotumor cerebri who presents today for follow up.    10/27/2023 patient seen in the ED for complaint of upper abdominal pain which had been ongoing intermittently for the last 1 to 2 years.  Pain worse with lying down at night and when eating later in the evening.  Reported she had tried OTC reflux medications intermittently with minimal improvement of symptoms.  For a few nights prior to ED visit, pain had radiated to her upper back which prompted her to seek medical attention.  She had improvement with GI cocktail.  Lab studies reassuring.  No abdominal imaging done.  Gastritis versus PUD suspected.  She was started on PPI and given GI referral for further evaluation.   Labs 10/27/2023: Hemoglobin 11, hematocrit 35.1, MCV 84.4, normal platelets, normal CMP, normal lipase, negative pregnancy test   Has had normocytic anemia which has been present as far back as 2017.  Seen in office 12/13/2023 for complaint of 1 to 2 years of epigastric abdominal pain worse at night and worsened by eating late in the evening before bed.  No tenderness on exam.  Denied any improvement on Protonix  20 mg daily and had stopped taking.  Endorsed occasional heartburn.  Reported having normal abdominal ultrasound last year. Plan was to start Protonix  40 mg twice daily and if no improvement at follow-up plan for EGD.  Labs showed stable anemia, normal liver and kidney function, normal vitamin B12 and folate, negative celiac disease.  Was found to have low iron and advised to start OTC ferrous sulfate  325 mg.  H. pylori ordered but does not appear this was completed.  Established care with PCP 02/25/2024.  Per Dr. Wilhelmenia, evidence of iron deficiency anemia consider upper and lower endoscopy for full  workup.    Previous GI Procedures/Imaging      Past Medical History:  Diagnosis Date   Headache    Obesity    Pseudotumor cerebri     Past Surgical History:  Procedure Laterality Date   NO PAST SURGERIES      Current Outpatient Medications  Medication Sig Dispense Refill   pantoprazole  (PROTONIX ) 40 MG tablet Take 1 tablet (40 mg total) by mouth 2 (two) times daily. (Patient not taking: Reported on 02/25/2024) 60 tablet 3   topiramate  (TOPAMAX ) 25 MG tablet Take 25 mg (1 pill) at bedtime for one week, then increase to 50 mg (2 pills) at bedtime, then increase to 75 mg (3 pills) at bedtime for one week, then increase to 100 mg (4 pills) at bedtime (Patient not taking: Reported on 12/13/2023) 120 tablet 3   No current facility-administered medications for this visit.    Allergies as of 03/16/2024   (No Known Allergies)    Family History  Problem Relation Age of Onset   Healthy Mother    Healthy Father    Heart disease Brother        MI   Brain cancer Paternal Grandfather 23 - 66       possibly cervical cancer instead of brain   Breast cancer Maternal Aunt 73 - 46   Breast cancer Paternal Aunt 72 - 31   Breast cancer Paternal Aunt 53 - 69   Sleep apnea Cousin    Breast cancer Cousin 40 - 34  paternal first cousin   Breast cancer Cousin 36 - 55       paternal first cousin   Colon cancer Neg Hx    Esophageal cancer Neg Hx    Liver disease Neg Hx     Social History   Tobacco Use   Smoking status: Never   Smokeless tobacco: Never  Vaping Use   Vaping status: Never Used  Substance Use Topics   Alcohol use: Yes    Comment: socially   Drug use: No     Review of Systems:    Constitutional: No weight loss, fever, chills, weakness or fatigue Eyes: No change in vision Ears, Nose, Throat:  No change in hearing or congestion Skin: No rash or itching Cardiovascular: No chest pain, chest pressure or palpitations   Respiratory: No SOB or  cough Gastrointestinal: See HPI and otherwise negative Genitourinary: No dysuria or change in urinary frequency Neurological: No headache, dizziness or syncope Musculoskeletal: No new muscle or joint pain Hematologic: No bleeding or bruising    Physical Exam:  Vital signs: LMP 02/20/2024 (Exact Date)   Constitutional: NAD, Well developed, Well nourished, alert and cooperative Head:  Normocephalic and atraumatic.  Eyes: No scleral icterus. Conjunctiva pink. Mouth: No oral lesions. Respiratory: Respirations even and unlabored. Lungs clear to auscultation bilaterally.  No wheezes, crackles, or rhonchi.  Cardiovascular:  Regular rate and rhythm. No murmurs. No peripheral edema. Gastrointestinal:  Soft, nondistended, nontender. No rebound or guarding. Normal bowel sounds. No appreciable masses or hepatomegaly. Rectal:  Not performed.  Neurologic:  Alert and oriented x4;  grossly normal neurologically.  Skin:   Dry and intact without significant lesions or rashes. Psychiatric: Oriented to person, place and time. Demonstrates good judgement and reason without abnormal affect or behaviors.   RELEVANT LABS AND IMAGING: CBC    Component Value Date/Time   WBC 8.0 12/13/2023 1616   RBC 4.29 12/13/2023 1616   HGB 11.2 (L) 12/13/2023 1616   HGB 12.1 11/16/2016 1526   HCT 35.0 (L) 12/13/2023 1616   HCT 37.7 11/16/2016 1526   PLT 408.0 (H) 12/13/2023 1616   PLT 352 11/16/2016 1526   MCV 81.5 12/13/2023 1616   MCV 87 11/16/2016 1526   MCH 26.4 10/27/2023 0947   MCHC 32.0 12/13/2023 1616   RDW 16.2 (H) 12/13/2023 1616   RDW 16.4 (H) 11/16/2016 1526   LYMPHSABS 2.4 12/13/2023 1616   LYMPHSABS 1.8 03/17/2016 1326   MONOABS 0.9 12/13/2023 1616   EOSABS 0.0 12/13/2023 1616   EOSABS 0.0 03/17/2016 1326   BASOSABS 0.0 12/13/2023 1616   BASOSABS 0.0 03/17/2016 1326    CMP     Component Value Date/Time   NA 137 12/13/2023 1616   K 4.6 12/13/2023 1616   CL 105 12/13/2023 1616   CO2 25  12/13/2023 1616   GLUCOSE 105 (H) 12/13/2023 1616   BUN 14 12/13/2023 1616   CREATININE 0.73 12/13/2023 1616   CALCIUM 8.7 12/13/2023 1616   PROT 7.3 12/13/2023 1616   ALBUMIN 4.1 12/13/2023 1616   AST 12 12/13/2023 1616   ALT 15 12/13/2023 1616   ALKPHOS 57 12/13/2023 1616   BILITOT 0.3 12/13/2023 1616   GFRNONAA >60 10/27/2023 0947   GFRAA >60 05/19/2016 1337     Assessment/Plan:   Repeat CBC, iron ferritin    Camie Furbish, PA-C [facility] 03/16/2024, 1:37 PM  Patient Care Team: Lorren Greig PARAS, NP as PCP - General (Nurse Practitioner)

## 2024-06-21 ENCOUNTER — Telehealth: Payer: Self-pay | Admitting: Gastroenterology

## 2024-06-21 NOTE — Telephone Encounter (Signed)
 The pt states that she is taking Iron and is following up with PCP and have an appt with her GYN as well for labs. She will call our office as needed for follow up

## 2024-06-21 NOTE — Telephone Encounter (Signed)
 Please check in with patient and see if she is still taking iron supplement. She should have repeat labs to monitor her iron deficiency anemia to include CBC, iron, ferritin.   Can have this done through PCP if GI symptoms have resolved and she does not want to be scheduled for follow-up office visit with us .
